# Patient Record
Sex: Male | Born: 1945 | Race: White | Hispanic: No | Marital: Married | Smoking: Never smoker
Health system: Southern US, Community
[De-identification: ages and names within clinical notes are randomized; demographics above are authoritative.]

## PROBLEM LIST (undated history)

## (undated) DIAGNOSIS — M1990 Unspecified osteoarthritis, unspecified site: Secondary | ICD-10-CM

## (undated) DIAGNOSIS — I2510 Atherosclerotic heart disease of native coronary artery without angina pectoris: Secondary | ICD-10-CM

## (undated) DIAGNOSIS — Z974 Presence of external hearing-aid: Secondary | ICD-10-CM

## (undated) DIAGNOSIS — I10 Essential (primary) hypertension: Secondary | ICD-10-CM

## (undated) DIAGNOSIS — R42 Dizziness and giddiness: Secondary | ICD-10-CM

## (undated) DIAGNOSIS — C801 Malignant (primary) neoplasm, unspecified: Secondary | ICD-10-CM

## (undated) DIAGNOSIS — I209 Angina pectoris, unspecified: Secondary | ICD-10-CM

## (undated) DIAGNOSIS — J45909 Unspecified asthma, uncomplicated: Secondary | ICD-10-CM

## (undated) DIAGNOSIS — K219 Gastro-esophageal reflux disease without esophagitis: Secondary | ICD-10-CM

## (undated) HISTORY — PX: VASECTOMY: SHX75

## (undated) HISTORY — PX: HERNIA REPAIR: SHX51

## (undated) HISTORY — PX: TONSILLECTOMY: SUR1361

## (undated) HISTORY — PX: CARDIAC CATHETERIZATION: SHX172

## (undated) HISTORY — PX: KNEE ARTHROSCOPY: SUR90

## (undated) HISTORY — PX: SHOULDER ARTHROSCOPY W/ ROTATOR CUFF REPAIR: SHX2400

---

## 2016-09-12 ENCOUNTER — Other Ambulatory Visit: Payer: Self-pay | Admitting: Surgery

## 2016-09-12 DIAGNOSIS — R2241 Localized swelling, mass and lump, right lower limb: Secondary | ICD-10-CM

## 2016-09-15 ENCOUNTER — Ambulatory Visit: Payer: Medicare HMO

## 2016-09-15 ENCOUNTER — Ambulatory Visit
Admission: RE | Admit: 2016-09-15 | Discharge: 2016-09-15 | Disposition: A | Discharge status: Home / Self Care | Payer: Medicare HMO | Source: Ambulatory Visit | Attending: Surgery | Admitting: Surgery

## 2016-09-15 DIAGNOSIS — R2241 Localized swelling, mass and lump, right lower limb: Secondary | ICD-10-CM | POA: Diagnosis not present

## 2016-09-15 LAB — POCT I-STAT CREATININE: CREATININE: 0.8 mg/dL (ref 0.61–1.24)

## 2016-09-15 MED ORDER — GADOBENATE DIMEGLUMINE 529 MG/ML IV SOLN
15.0000 mL | Freq: Once | INTRAVENOUS | Status: AC | PRN
  Administered 2016-09-15: 15 mL via INTRAVENOUS

## 2016-10-25 ENCOUNTER — Encounter
Admission: RE | Admit: 2016-10-25 | Discharge: 2016-10-25 | Disposition: A | Discharge status: Home / Self Care | Payer: Medicare HMO | Source: Ambulatory Visit | Attending: Surgery | Admitting: Surgery

## 2016-10-25 DIAGNOSIS — I10 Essential (primary) hypertension: Secondary | ICD-10-CM | POA: Diagnosis not present

## 2016-10-25 DIAGNOSIS — Z01810 Encounter for preprocedural cardiovascular examination: Secondary | ICD-10-CM | POA: Insufficient documentation

## 2016-10-25 DIAGNOSIS — Z01812 Encounter for preprocedural laboratory examination: Secondary | ICD-10-CM | POA: Diagnosis present

## 2016-10-25 DIAGNOSIS — R42 Dizziness and giddiness: Secondary | ICD-10-CM | POA: Diagnosis not present

## 2016-10-25 DIAGNOSIS — R001 Bradycardia, unspecified: Secondary | ICD-10-CM | POA: Diagnosis not present

## 2016-10-25 HISTORY — DX: Unspecified asthma, uncomplicated: J45.909

## 2016-10-25 HISTORY — DX: Dizziness and giddiness: R42

## 2016-10-25 HISTORY — DX: Malignant (primary) neoplasm, unspecified: C80.1

## 2016-10-25 HISTORY — DX: Atherosclerotic heart disease of native coronary artery without angina pectoris: I25.10

## 2016-10-25 HISTORY — DX: Essential (primary) hypertension: I10

## 2016-10-25 HISTORY — DX: Unspecified osteoarthritis, unspecified site: M19.90

## 2016-10-25 HISTORY — DX: Angina pectoris, unspecified: I20.9

## 2016-10-25 LAB — COMPREHENSIVE METABOLIC PANEL
ALT: 18 U/L (ref 17–63)
AST: 24 U/L (ref 15–41)
Albumin: 3.7 g/dL (ref 3.5–5.0)
Alkaline Phosphatase: 37 U/L — ABNORMAL LOW (ref 38–126)
Anion gap: 8 (ref 5–15)
BUN: 16 mg/dL (ref 6–20)
CHLORIDE: 102 mmol/L (ref 101–111)
CO2: 28 mmol/L (ref 22–32)
CREATININE: 0.85 mg/dL (ref 0.61–1.24)
Calcium: 8.8 mg/dL — ABNORMAL LOW (ref 8.9–10.3)
GFR calc non Af Amer: >60 mL/min (ref >60)
Glucose, Bld: 101 mg/dL — ABNORMAL HIGH (ref 65–99)
POTASSIUM: 4 mmol/L (ref 3.5–5.1)
SODIUM: 138 mmol/L (ref 135–145)
Total Bilirubin: 1.2 mg/dL (ref 0.3–1.2)
Total Protein: 6.8 g/dL (ref 6.5–8.1)

## 2016-10-25 LAB — CBC
HCT: 40.3 % (ref 40.0–52.0)
Hemoglobin: 13.4 g/dL (ref 13.0–18.0)
MCH: 28.6 pg (ref 26.0–34.0)
MCHC: 33.2 g/dL (ref 32.0–36.0)
MCV: 86.2 fL (ref 80.0–100.0)
PLATELETS: 102 10*3/uL — AB (ref 150–440)
RBC: 4.68 MIL/uL (ref 4.40–5.90)
RDW: 13.4 % (ref 11.5–14.5)
WBC: 7.3 10*3/uL (ref 3.8–10.6)

## 2016-10-25 LAB — DIFFERENTIAL
BASOS ABS: 0 10*3/uL (ref 0–0.1)
BASOS PCT: 1 %
EOS ABS: 0.3 10*3/uL (ref 0–0.7)
Eosinophils Relative: 4 %
Lymphocytes Relative: 25 %
Lymphs Abs: 1.8 10*3/uL (ref 1.0–3.6)
MONO ABS: 0.8 10*3/uL (ref 0.2–1.0)
Monocytes Relative: 11 %
NEUTROS ABS: 4.3 10*3/uL (ref 1.4–6.5)
Neutrophils Relative %: 59 %

## 2016-10-25 NOTE — Pre-Procedure Instructions (Signed)
Component Name Value Ref Range  Vent Rate (bpm) 57   PR Interval (msec) 198   QRS Interval (msec) 108   QT Interval (msec) 408   QTc (msec) 397   Result Narrative  Sinus bradycardia Nonspecific ST abnormality Abnormal ECG When compared with ECG of 26-Feb-2016 09:17, Nonspecific T wave abnormalities no longer evident in Lateral leads I reviewed and concur with this report. Electronically signed [id number], MD, Christia Reading ([id number]) on 03/04/2016 4:09:37 PM  Status Results Details

## 2016-10-25 NOTE — Patient Instructions (Signed)
Your procedure is scheduled on: November 04, 2016 (FRIDAY ) Report to Same Day Surgery 2nd floor medical mall ([place] Entrance-take elevator on left to 2nd floor.  Check in with surgery information desk.) To find out your arrival time please call [phone] between 1PM - 3PM on November 03, 2016 (THURSDAY ) Remember: Instructions that are not followed completely may result in serious medical risk, up to and including death, or upon the discretion of your surgeon and anesthesiologist your surgery may need to be rescheduled.    _x___ 1. Do not eat food or drink liquids after midnight. No gum chewing or  hard candies                         __x__ 2. No Alcohol for 24 hours before or after surgery.   __x__3. No Smoking for 24 prior to surgery.   ____  4. Bring all medications with you on the day of surgery if instructed.    __x__ 5. Notify your doctor if there is any change in your medical condition     (cold, fever, infections).     Do not wear jewelry, make-up, hairpins, clips or nail polish.  Do not wear lotions, powders, or perfumes.   Do not shave 48 hours prior to surgery. Men may shave face and neck.  Do not bring valuables to the hospital.    [facility] is not responsible for any belongings or valuables.               Contacts, dentures or bridgework may not be worn into surgery.  Leave your suitcase in the car. After surgery it may be brought to your room.  For patients admitted to the hospital, discharge time is determined by your treatment team                        Patients discharged the day of surgery will not be allowed to drive home.  You will need someone to drive you home and stay with you the night of your procedure.    Please read over the following fact sheets that you were given:   [facility] Preparing for Surgery and or MRSA Information   ___ Take anti-hypertensive (unless it includes a diuretic), cardiac, seizure, asthma,     anti-reflux and psychiatric  medicines. These include:  1.   2.  3.  4.  5.  6.  ____Fleets enema or Magnesium Citrate as directed.   _x___ Use CHG Soap or sage wipes as directed on instruction sheet   ____ Use inhalers on the day of surgery and bring to hospital day of surgery  ____ Stop Metformin and Janumet 2 days prior to surgery.    ____ Take 1/2 of usual insulin dose the night before surgery and none on the morning surgery      _x___ Follow recommendations from Cardiologist, Pulmonologist or PCP regarding          stopping Aspirin, Coumadin, Plavix ,Eliquis, Effient, or Pradaxa, and Pletal. (PATIENT INSTRUCTED BY DR Tamala Julian OFFICE TO STOP ASPIRIN ON JULY  26 )  X____Stop Anti-inflammatories such as Advil, Aleve, Ibuprofen, Motrin, Naproxen, Naprosyn, Goodies powders or aspirin products. OK to take Tylenol    _x___ Stop supplements until after surgery.  But may continue Vitamin D, Vitamin B, and multivitamin  ( STOP GLUCOSAMINE CHONDROITIN NOW  )         ____ [employer]  C-Pap to the hospital.

## 2016-11-02 NOTE — Pre-Procedure Instructions (Signed)
CALLED DR DGNPHQ'N OFFICE HAD TO LM ON NURSES'S LINE TO FAX CLEARANCE TO PAT.DEBBIE IN MEDICAL RECORDS AT THAT OFFICE WILL FAX 48 HOUR HOLTER RESULTS

## 2016-11-02 NOTE — Pre-Procedure Instructions (Signed)
HOLTER REPORT ON CHART

## 2016-11-03 NOTE — Pre-Procedure Instructions (Signed)
Cardiology note from dr Lysle Rubens clearing for surgery on chart

## 2016-11-04 ENCOUNTER — Ambulatory Visit: Payer: Medicare HMO | Admitting: Certified Registered"

## 2016-11-04 ENCOUNTER — Encounter
Admission: RE | Disposition: A | Discharge status: Home / Self Care | Payer: Self-pay | Source: Ambulatory Visit | Attending: Surgery

## 2016-11-04 ENCOUNTER — Ambulatory Visit
Admission: RE | Admit: 2016-11-04 | Discharge: 2016-11-04 | Disposition: A | Discharge status: Home / Self Care | Payer: Medicare HMO | Source: Ambulatory Visit | Attending: Surgery | Admitting: Surgery

## 2016-11-04 ENCOUNTER — Encounter: Payer: Self-pay | Admitting: *Deleted

## 2016-11-04 DIAGNOSIS — J4520 Mild intermittent asthma, uncomplicated: Secondary | ICD-10-CM | POA: Insufficient documentation

## 2016-11-04 DIAGNOSIS — M1990 Unspecified osteoarthritis, unspecified site: Secondary | ICD-10-CM | POA: Diagnosis not present

## 2016-11-04 DIAGNOSIS — R2241 Localized swelling, mass and lump, right lower limb: Secondary | ICD-10-CM | POA: Diagnosis not present

## 2016-11-04 DIAGNOSIS — R42 Dizziness and giddiness: Secondary | ICD-10-CM | POA: Insufficient documentation

## 2016-11-04 DIAGNOSIS — Z808 Family history of malignant neoplasm of other organs or systems: Secondary | ICD-10-CM | POA: Diagnosis not present

## 2016-11-04 DIAGNOSIS — Z888 Allergy status to other drugs, medicaments and biological substances status: Secondary | ICD-10-CM | POA: Insufficient documentation

## 2016-11-04 DIAGNOSIS — N529 Male erectile dysfunction, unspecified: Secondary | ICD-10-CM | POA: Diagnosis not present

## 2016-11-04 DIAGNOSIS — I10 Essential (primary) hypertension: Secondary | ICD-10-CM | POA: Insufficient documentation

## 2016-11-04 DIAGNOSIS — Z79899 Other long term (current) drug therapy: Secondary | ICD-10-CM | POA: Diagnosis not present

## 2016-11-04 DIAGNOSIS — I25118 Atherosclerotic heart disease of native coronary artery with other forms of angina pectoris: Secondary | ICD-10-CM | POA: Insufficient documentation

## 2016-11-04 DIAGNOSIS — L02425 Furuncle of right lower limb: Secondary | ICD-10-CM | POA: Insufficient documentation

## 2016-11-04 DIAGNOSIS — Z7982 Long term (current) use of aspirin: Secondary | ICD-10-CM | POA: Diagnosis not present

## 2016-11-04 DIAGNOSIS — I6523 Occlusion and stenosis of bilateral carotid arteries: Secondary | ICD-10-CM | POA: Diagnosis not present

## 2016-11-04 HISTORY — PX: EXCISION MASS LOWER EXTREMETIES: SHX6705

## 2016-11-04 SURGERY — EXCISION MASS LOWER EXTREMITIES
Anesthesia: General | Site: Thigh | Laterality: Right | Wound class: Clean

## 2016-11-04 MED ORDER — SEVOFLURANE IN SOLN
RESPIRATORY_TRACT | Status: AC
  Filled 2016-11-04: qty 250

## 2016-11-04 MED ORDER — FENTANYL CITRATE (PF) 100 MCG/2ML IJ SOLN: 25.0000 ug | INTRAMUSCULAR | Status: DC | PRN

## 2016-11-04 MED ORDER — BUPIVACAINE HCL (PF) 0.5 % IJ SOLN
INTRAMUSCULAR | Status: AC
  Filled 2016-11-04: qty 30

## 2016-11-04 MED ORDER — FENTANYL CITRATE (PF) 100 MCG/2ML IJ SOLN
INTRAMUSCULAR | Status: AC
  Filled 2016-11-04: qty 2

## 2016-11-04 MED ORDER — LIDOCAINE HCL (CARDIAC) 20 MG/ML IV SOLN
INTRAVENOUS | Status: DC | PRN
  Administered 2016-11-04: 60 mg via INTRAVENOUS

## 2016-11-04 MED ORDER — ONDANSETRON HCL 4 MG/2ML IJ SOLN
INTRAMUSCULAR | Status: AC
  Filled 2016-11-04: qty 2

## 2016-11-04 MED ORDER — ONDANSETRON HCL 4 MG/2ML IJ SOLN: 4.0000 mg | Freq: Once | INTRAMUSCULAR | Status: DC | PRN

## 2016-11-04 MED ORDER — HYDROCODONE-ACETAMINOPHEN 5-325 MG PO TABS: 1.0000 | ORAL_TABLET | ORAL | 0 refills | Status: DC | PRN

## 2016-11-04 MED ORDER — LIDOCAINE HCL (PF) 2 % IJ SOLN
INTRAMUSCULAR | Status: AC
  Filled 2016-11-04: qty 2

## 2016-11-04 MED ORDER — PROPOFOL 10 MG/ML IV BOLUS
INTRAVENOUS | Status: AC
  Filled 2016-11-04: qty 20

## 2016-11-04 MED ORDER — MIDAZOLAM HCL 2 MG/2ML IJ SOLN
INTRAMUSCULAR | Status: AC
  Filled 2016-11-04: qty 2

## 2016-11-04 MED ORDER — EPHEDRINE SULFATE 50 MG/ML IJ SOLN
INTRAMUSCULAR | Status: DC | PRN
  Administered 2016-11-04: 10 mg via INTRAVENOUS

## 2016-11-04 MED ORDER — GLYCOPYRROLATE 0.2 MG/ML IJ SOLN
INTRAMUSCULAR | Status: DC | PRN
  Administered 2016-11-04: 0.2 mg via INTRAVENOUS

## 2016-11-04 MED ORDER — FENTANYL CITRATE (PF) 100 MCG/2ML IJ SOLN
INTRAMUSCULAR | Status: DC | PRN
  Administered 2016-11-04: 25 ug via INTRAVENOUS

## 2016-11-04 MED ORDER — EPHEDRINE SULFATE 50 MG/ML IJ SOLN
INTRAMUSCULAR | Status: AC
  Filled 2016-11-04: qty 1

## 2016-11-04 MED ORDER — BUPIVACAINE-EPINEPHRINE (PF) 0.5% -1:200000 IJ SOLN
INTRAMUSCULAR | Status: AC
  Filled 2016-11-04: qty 30

## 2016-11-04 MED ORDER — DEXAMETHASONE SODIUM PHOSPHATE 10 MG/ML IJ SOLN
INTRAMUSCULAR | Status: DC | PRN
  Administered 2016-11-04: 10 mg via INTRAVENOUS

## 2016-11-04 MED ORDER — HYDROCODONE-ACETAMINOPHEN 5-325 MG PO TABS
1.0000 | ORAL_TABLET | ORAL | Status: DC | PRN
Start: 2016-11-04 — End: 2016-11-04

## 2016-11-04 MED ORDER — FAMOTIDINE 20 MG PO TABS
20.0000 mg | ORAL_TABLET | Freq: Once | ORAL | Status: AC
  Administered 2016-11-04: 20 mg via ORAL

## 2016-11-04 MED ORDER — PHENYLEPHRINE HCL 10 MG/ML IJ SOLN
INTRAMUSCULAR | Status: AC
  Filled 2016-11-04: qty 1

## 2016-11-04 MED ORDER — DEXAMETHASONE SODIUM PHOSPHATE 10 MG/ML IJ SOLN
INTRAMUSCULAR | Status: AC
  Filled 2016-11-04: qty 1

## 2016-11-04 MED ORDER — FAMOTIDINE 20 MG PO TABS
ORAL_TABLET | ORAL | Status: AC
  Filled 2016-11-04: qty 1

## 2016-11-04 MED ORDER — ONDANSETRON HCL 4 MG/2ML IJ SOLN
INTRAMUSCULAR | Status: DC | PRN
  Administered 2016-11-04: 4 mg via INTRAVENOUS

## 2016-11-04 MED ORDER — LIDOCAINE HCL (PF) 1 % IJ SOLN
INTRAMUSCULAR | Status: AC
  Filled 2016-11-04: qty 30

## 2016-11-04 MED ORDER — GLYCOPYRROLATE 0.2 MG/ML IJ SOLN
INTRAMUSCULAR | Status: AC
  Filled 2016-11-04: qty 1

## 2016-11-04 MED ORDER — BUPIVACAINE-EPINEPHRINE (PF) 0.5% -1:200000 IJ SOLN
INTRAMUSCULAR | Status: DC | PRN
  Administered 2016-11-04: 15 mL via PERINEURAL

## 2016-11-04 MED ORDER — MIDAZOLAM HCL 2 MG/2ML IJ SOLN
INTRAMUSCULAR | Status: DC | PRN
  Administered 2016-11-04: 2 mg via INTRAVENOUS

## 2016-11-04 MED ORDER — PROPOFOL 10 MG/ML IV BOLUS
INTRAVENOUS | Status: DC | PRN
  Administered 2016-11-04: 140 mg via INTRAVENOUS
  Administered 2016-11-04: 20 mg via INTRAVENOUS

## 2016-11-04 MED ORDER — LACTATED RINGERS IV SOLN
INTRAVENOUS | Status: DC
  Administered 2016-11-04: 08:00:00 via INTRAVENOUS

## 2016-11-04 SURGICAL SUPPLY — 30 items
BLADE CLIPPER SURG (BLADE) ×2 IMPLANT
BLADE SURG 15 STRL LF DISP TIS (BLADE) ×1 IMPLANT
BLADE SURG 15 STRL SS (BLADE) ×1
CHLORAPREP W/TINT 26ML (MISCELLANEOUS) ×2 IMPLANT
DERMABOND ADVANCED (GAUZE/BANDAGES/DRESSINGS) ×1
DERMABOND ADVANCED .7 DNX12 (GAUZE/BANDAGES/DRESSINGS) ×1 IMPLANT
DRAPE LAPAROTOMY 100X77 ABD (DRAPES) ×2 IMPLANT
ELECT REM PT RETURN 9FT ADLT (ELECTROSURGICAL) ×2
ELECTRODE REM PT RTRN 9FT ADLT (ELECTROSURGICAL) ×1 IMPLANT
GLOVE BIO SURGEON STRL SZ7 (GLOVE) ×4 IMPLANT
GLOVE BIO SURGEON STRL SZ7.5 (GLOVE) ×2 IMPLANT
GLOVE BIOGEL PI IND STRL 7.5 (GLOVE) ×2 IMPLANT
GLOVE BIOGEL PI INDICATOR 7.5 (GLOVE) ×2
GOWN STRL REUS W/ TWL LRG LVL3 (GOWN DISPOSABLE) ×3 IMPLANT
GOWN STRL REUS W/TWL LRG LVL3 (GOWN DISPOSABLE) ×3
KIT RM TURNOVER STRD PROC AR (KITS) ×2 IMPLANT
LABEL OR SOLS (LABEL) ×2 IMPLANT
NEEDLE HYPO 25X1 1.5 SAFETY (NEEDLE) ×2 IMPLANT
NS IRRIG 500ML POUR BTL (IV SOLUTION) ×2 IMPLANT
PACK BASIN MINOR ARMC (MISCELLANEOUS) ×2 IMPLANT
SUT CHROMIC 4 0 RB 1X27 (SUTURE) ×2 IMPLANT
SUT MNCRL 3-0 UNDYED SH (SUTURE) ×1 IMPLANT
SUT MNCRL 4-0 (SUTURE) ×1
SUT MNCRL 4-0 27XMFL (SUTURE) ×1
SUT MONOCRYL 3-0 UNDYED (SUTURE) ×1
SUT VIC AB 4-0 SH 27 (SUTURE) ×1
SUT VIC AB 4-0 SH 27XANBCTRL (SUTURE) ×1 IMPLANT
SUTURE MNCRL 4-0 27XMF (SUTURE) ×1 IMPLANT
SYR BULB EAR ULCER 3OZ GRN STR (SYRINGE) ×2 IMPLANT
SYRINGE 10CC LL (SYRINGE) ×2 IMPLANT

## 2016-11-04 NOTE — Progress Notes (Signed)
Dr. Tamala Julian into see and checked throat

## 2016-11-04 NOTE — Anesthesia Post-op Follow-up Note (Cosign Needed)
Anesthesia QCDR form completed.        

## 2016-11-04 NOTE — Discharge Instructions (Signed)
Take Tylenol or Norco if needed for pain.  Should not drive or do anything dangerous when taking Norco.  Resume aspirin on Sunday.  May shower and blot dry.  Gradually increase activities as tolerated.

## 2016-11-04 NOTE — Progress Notes (Signed)
Dr. Andree Elk into see and check pt

## 2016-11-04 NOTE — Transfer of Care (Signed)
Immediate Anesthesia Transfer of Care Note  Patient: Earl Lyons  Procedure(s) Performed: Procedure(s): EXCISION OF MASS  RIGHT THIGH (Right)  Patient Location: PACU  Anesthesia Type:General  Level of Consciousness: drowsy and patient cooperative  Airway & Oxygen Therapy: Patient Spontanous Breathing and Patient connected to face mask oxygen  Post-op Assessment: Report given to RN and Post -op Vital signs reviewed and stable  Post vital signs: Reviewed and stable  Last Vitals:  Vitals:   11/04/16 0606 11/04/16 0900  BP: (!) 152/65   Pulse: (!) 44   Resp: 16   Temp: (!) 36.4 C (!) (P) 36.2 C    Last Pain:  Vitals:   11/04/16 0606  TempSrc: Oral         Complications: No apparent anesthesia complications

## 2016-11-04 NOTE — Anesthesia Procedure Notes (Signed)
Procedure Name: LMA Insertion Date/Time: 11/04/2016 7:41 AM Performed by: Silvana Newness Pre-anesthesia Checklist: Patient identified, Emergency Drugs available, Suction available, Patient being monitored and Timeout performed Patient Re-evaluated:Patient Re-evaluated prior to induction Oxygen Delivery Method: Circle system utilized Preoxygenation: Pre-oxygenation with 100% oxygen Induction Type: IV induction Ventilation: Mask ventilation without difficulty LMA: LMA inserted LMA Size: 4.0 Number of attempts: 1 Placement Confirmation: positive ETCO2 and breath sounds checked- equal and bilateral Tube secured with: Tape Dental Injury: Teeth and Oropharynx as per pre-operative assessment

## 2016-11-04 NOTE — H&P (Signed)
  He comes in today prepared for excision of a mass of the right anterior proximal thigh.  Since the office exam he has developed a boil of the left distal anterior thigh. The boil was lanced several days ago and now improving.  On exam the mass of the right thigh was identified. Also noted that the site of the boil of the left thigh appears to be decreasing in size.  Lab work was reviewed play count is moderately low.  I discussed the plan for excision of a mass of the right anterior thigh. The right side was marked YES

## 2016-11-04 NOTE — Progress Notes (Signed)
Complains with discomfort throat pallet   Dr Andree Elk xcalled to check patient

## 2016-11-04 NOTE — Op Note (Signed)
OPERATIVE REPORT  PREOPERATIVE  DIAGNOSIS: . Mass of right thigh  POSTOPERATIVE DIAGNOSIS: . Mass of right thigh  PROCEDURE: . Excision mass of right thigh  ANESTHESIA:  General  SURGEON: Rochel Brome  MD   INDICATIONS: . He has a history of a mass of the right side which dates back to the 98s. He reports in recent months he has had increased size of the mass with some minor pain. He did have physical findings of a heart palpable mass approximately 6 x 6 cm in dimension with a smaller 2-3 cm area of prominence centrally located. MRI appeared benign. Surgery was recommended for definitive treatment.  With the patient on the operating table in the supine position he was placed under general anesthesia. The right proximal anterior lateral portion of the thigh was prepared with ChloraPrep and draped in a sterile manner. A longitudinally oriented incision was made 7 centimeters in length and carried down through the thin layer of subcutaneous tissues. Several small bleeding points are cauterized. The mass was encountered and was hard. This was dissected free from surrounding tissues in which it was densely adherent. The underside of the mass did involve the deep fascia and some of the fascia was removed in continuity with the mass. The mass was peeled off underlying muscle. During the course of dissection there was some drainage of a wet thick chalky-appearing material which was a yellow white color but did not appear to be pus. The mass was submitted in formalin for routine pathology. The wound was inspected and numerous small bleeding points were cauterized. Subcuticular tissues and also deeper tissues including fascia and muscle surrounding cautery artifact were infiltrated with half percent Sensorcaine with epinephrine. There was approximately 2 cm gap in  fascia and was not closed. Subcutaneous tissues were approximated with interrupted inverted 4-0 chromic suture. The skin was closed with running  4-0 Monocryl subcuticular suture and Dermabond  The patient appeared to tolerate the procedure satisfactorily and was then prepared for transfer to the recovery room  [employer].D.

## 2016-11-04 NOTE — Anesthesia Preprocedure Evaluation (Signed)
Anesthesia Evaluation  Patient identified by MRN, date of birth, ID band Patient awake    Reviewed: Allergy & Precautions, H&P , NPO status , Patient's Chart, lab work & pertinent test results, reviewed documented beta blocker date and time   Airway Mallampati: II  TM Distance: >3 FB Neck ROM: full    Dental  (+) Teeth Intact   Pulmonary neg pulmonary ROS, neg shortness of breath, asthma ,    Pulmonary exam normal        Cardiovascular Exercise Tolerance: Good hypertension, + angina with exertion + CAD  negative cardio ROS Normal cardiovascular exam Rate:Normal  Recent Cardiology eval with [facility] noted.  Stable 1 vessel disease.  Walks 33mile twice a day without recurrent angina after initiation of medication management.  JA   Neuro/Psych negative neurological ROS  negative psych ROS   GI/Hepatic negative GI ROS, Neg liver ROS,   Endo/Other  negative endocrine ROS  Renal/GU negative Renal ROS  negative genitourinary   Musculoskeletal   Abdominal   Peds  Hematology negative hematology ROS (+)   Anesthesia Other Findings   Reproductive/Obstetrics negative OB ROS                             Anesthesia Physical Anesthesia Plan  ASA: III  Anesthesia Plan: General LMA   Post-op Pain Management:    Induction:   PONV Risk Score and Plan: 3 and Ondansetron, Dexamethasone, Midazolam and Propofol infusion  Airway Management Planned:   Additional Equipment:   Intra-op Plan:   Post-operative Plan:   Informed Consent: I have reviewed the patients History and Physical, chart, labs and discussed the procedure including the risks, benefits and alternatives for the proposed anesthesia with the patient or authorized representative who has indicated his/her understanding and acceptance.     Plan Discussed with: CRNA  Anesthesia Plan Comments:         Anesthesia Quick Evaluation

## 2016-11-07 LAB — SURGICAL PATHOLOGY

## 2016-11-08 NOTE — Anesthesia Postprocedure Evaluation (Signed)
Anesthesia Post Note  Patient: Earl Lyons  Procedure(s) Performed: Procedure(s) (LRB): EXCISION OF MASS  RIGHT THIGH (Right)  Patient location during evaluation: PACU Anesthesia Type: General Level of consciousness: awake and alert Pain management: pain level controlled Vital Signs Assessment: post-procedure vital signs reviewed and stable Respiratory status: spontaneous breathing, nonlabored ventilation, respiratory function stable and patient connected to nasal cannula oxygen Cardiovascular status: blood pressure returned to baseline and stable Postop Assessment: no signs of nausea or vomiting Anesthetic complications: no     Last Vitals:  Vitals:   11/04/16 0942 11/04/16 1056  BP: (!) 148/56 139/61  Pulse: (!) 51 (!) 52  Resp: 16   Temp: (!) 36.3 C     Last Pain:  Vitals:   11/07/16 0754  TempSrc:   PainSc: 0-No pain                 Molli Barrows

## 2017-09-04 ENCOUNTER — Other Ambulatory Visit: Payer: Self-pay | Admitting: Nurse Practitioner

## 2017-09-04 DIAGNOSIS — R42 Dizziness and giddiness: Secondary | ICD-10-CM

## 2017-09-12 ENCOUNTER — Ambulatory Visit (HOSPITAL_COMMUNITY)
Admission: RE | Admit: 2017-09-12 | Discharge: 2017-09-12 | Disposition: A | Discharge status: Home / Self Care | Payer: Medicare HMO | Source: Ambulatory Visit | Attending: Nurse Practitioner | Admitting: Nurse Practitioner

## 2017-09-12 DIAGNOSIS — J3489 Other specified disorders of nose and nasal sinuses: Secondary | ICD-10-CM | POA: Insufficient documentation

## 2017-09-12 DIAGNOSIS — R9082 White matter disease, unspecified: Secondary | ICD-10-CM | POA: Diagnosis not present

## 2017-09-12 DIAGNOSIS — R42 Dizziness and giddiness: Secondary | ICD-10-CM | POA: Diagnosis not present

## 2017-09-12 LAB — POCT I-STAT CREATININE: Creatinine, Ser: 1 mg/dL (ref 0.61–1.24)

## 2017-09-12 MED ORDER — GADOBENATE DIMEGLUMINE 529 MG/ML IV SOLN
15.0000 mL | Freq: Once | INTRAVENOUS | Status: AC | PRN
  Administered 2017-09-12: 15 mL via INTRAVENOUS

## 2017-09-14 ENCOUNTER — Ambulatory Visit: Payer: Medicare HMO

## 2018-12-27 ENCOUNTER — Other Ambulatory Visit: Payer: Self-pay

## 2018-12-27 ENCOUNTER — Encounter: Payer: Self-pay | Admitting: Hematology and Oncology

## 2018-12-27 NOTE — Progress Notes (Signed)
[facility]  [address] [place], [place] [address] Phone: [phone]  Fax: [phone]   Clinic Day:  12/28/2018  Referring physician: Jannet Mantis, *  Chief Complaint: Earl Lyons is a 73 y.o. male with a neoplasm of uncertain behavior of the skin who is referred in consultation by Dr. Ree Edman for assessment and management.   HPI:  The patient has a history of excessive sun exposure and sunburns.  He has a history of stage 0 malignant melanoma ([id number]) in 04/22/2016.  He has a history of squamous cell carcinoma of the skin.  He describes a prior Moh's procedure.   The patient noticed a lesion on his forehead about 4-6 weeks ago. He initially thought it was an insect bite.  He was seen on 11/22/2018 for regular dermatology follow-up.  Notes indicate a 1.2 cm brown telangiectasia ectatic nodule as well as a 0.8 cm pink scaly plaque lesion with central eschar.    Forehead medial shave biopsy on 11/22/2018 revealed marginal zone lymphoma with margins involved (case# [id number]).  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  In addition, there was squamous cell carcinoma in situ, ulcerated, at the medial frontal scalp.  There was actinic keratosis extending to the edges but without carcinoma.  Symptomatically, he notes some weight loss; he is 3 lbs lighter than he was in high school (weight 155 pounds). He denies fevers or sweats.  He denies any adenopathy or early satiety.  He denies any bruising or bleeding.  He sees a cardiologist. He has single vessel coronary artery disease. He has a chronically low pulse (typically 50); today his pulse is 48. He notes that this is normal for him as he is fairly active. He is a former runner.   He describes a healthy diet. He eats leafy green vegetables. He does not eat as much meat as he use to eat.   He donates blood regularly. The  last time he donated was about a year ago.  Family history is notable for skin cancer in his mother. There is no known history for any blood disorders, blood cancers, or lymphoma.    Past Medical History:  Diagnosis Date  . Anginal pain ([place])    In the past--not currently  . Arthritis    Osteoarthritis  . Asthma    IN THE PAST (NO INHALERS IN THE PAST FIVE YEARS)  . Cancer ([place])    Melanomas Skin Cancer  . Coronary artery disease   . Hypertension   . Vertigo     Past Surgical History:  Procedure Laterality Date  . CARDIAC CATHETERIZATION    . EXCISION MASS LOWER EXTREMETIES Right 11/04/2016   Procedure: EXCISION OF MASS  RIGHT THIGH;  Surgeon: Leonie Green, MD;  Location: [facility] ORS;  Service: General;  Laterality: Right;  . HERNIA REPAIR Right    Inguinal Hernia Repair  . KNEE ARTHROSCOPY Right   . SHOULDER ARTHROSCOPY W/ ROTATOR CUFF REPAIR Right   . TONSILLECTOMY    . VASECTOMY      Family History  Problem Relation Age of Onset  . Skin cancer Mother   . Emphysema Mother   . Colon cancer Maternal Uncle     Social History:  reports that he has never smoked. He has never used smokeless tobacco. He reports that he does not drink alcohol or use drugs. He denies any known exposure to chemicals. He states that  he underwent a radiation treatment to treat acne as a child. He use to run in school; more recently he has been biking for exercise. 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 He has lived in [place] since 2013. He is married. He has 3 daughters. The patient is alone today.  Allergies:  Allergies  Allergen Reactions  . Metoprolol Other (See Comments)    bradycardia    Current Medications: Current Outpatient Medications  Medication Sig Dispense Refill  . amLODipine (NORVASC) 5 MG tablet Take 5 mg by mouth every evening.    Marland Kitchen aspirin EC 81 MG tablet Take 81 mg by mouth every evening.     Marland Kitchen atorvastatin (LIPITOR) 40 MG tablet Take 40  mg by mouth every evening.    . cholecalciferol (VITAMIN D) 1000 units tablet Take 1,000 Units by mouth daily.    . fluocinonide cream (LIDEX) 0.05 % Apply topically bid prn    . fluticasone (FLONASE) 50 MCG/ACT nasal spray Place into the nose.    . lamoTRIgine (LAMICTAL) 150 MG tablet Take by mouth.    . lamoTRIgine (LAMICTAL) 25 MG tablet     . lisinopril (ZESTRIL) 20 MG tablet Take by mouth.    . nitroGLYCERIN (NITROSTAT) 0.4 MG SL tablet Place under the tongue.    . sildenafil (VIAGRA) 50 MG tablet Take by mouth.    . triamcinolone ointment (KENALOG) 0.5 % Apply 1 application topically 2 (two) times daily as needed (itching). Applies to inside of ears when itching      No current facility-administered medications for this visit.     Review of Systems  Constitutional: Negative for chills, diaphoresis, fever, malaise/fatigue and weight loss (3 lbs under college weight).  HENT: Negative.  Negative for congestion, ear pain, hearing loss, nosebleeds, sinus pain and sore throat.   Eyes: Negative.  Negative for blurred vision, double vision, photophobia and pain.       Vision is slightly worse.  He is due for vision exam.  Respiratory: Negative.  Negative for cough, hemoptysis, shortness of breath and wheezing.   Cardiovascular: Negative.  Negative for chest pain and leg swelling.       Baseline asymptomatic bradycardia.  Gastrointestinal: Negative.  Negative for blood in stool, constipation, diarrhea, melena, nausea and vomiting.       Colonoscopy due 2021.  Genitourinary: Negative.  Negative for frequency, hematuria and urgency.  Musculoskeletal: Positive for joint pain (arthritis in hands, knees, R foot, L shoulder). Negative for back pain and myalgias.  Skin: Negative for rash.       Many moles over the years.   Neurological: Negative for dizziness and weakness.  Endo/Heme/Allergies: Negative.  Does not bruise/bleed easily.  Psychiatric/Behavioral: Negative for depression and memory  loss. The patient is not nervous/anxious.    Performance status (ECOG): 0  Vitals Blood pressure (!) 149/67, pulse (!) 48, temperature (!) 96.2 F (35.7 C), temperature source Tympanic, resp. rate 16, height 5\' 11"  (1.803 m), weight 158 lb 2.9 oz (71.7 kg).   Physical Exam  Constitutional: He is oriented to person, place, and time. He appears well-developed and well-nourished. No distress.  HENT:  Head: Atraumatic.  Mouth/Throat: No oropharyngeal exudate.  Thin short gray hair.  Mask.  Eyes: Pupils are equal, round, and reactive to light. Conjunctivae and EOM are normal. No scleral icterus.  Blue eyes.  Neck: Normal range of motion. Neck supple. No JVD present.  Cardiovascular: Regular rhythm. Bradycardia present. Exam reveals no  gallop and no friction rub.  No murmur heard. Pulmonary/Chest: Effort normal and breath sounds normal. He has no wheezes. He has no rales.  Abdominal: Soft. Bowel sounds are normal. He exhibits no mass. There is no abdominal tenderness. There is no rebound and no guarding.  Musculoskeletal:        General: No tenderness or edema.  Lymphadenopathy:    He has no cervical adenopathy.    He has no axillary adenopathy.       Right: No inguinal and no supraclavicular adenopathy present.       Left: No inguinal and no supraclavicular adenopathy present.  Neurological: He is alert and oriented to person, place, and time.  Skin: Skin is warm. No rash noted. He is not diaphoretic. No pallor.  Skin changes s/p biopsy with slight nodularity on scalp.  Three well healed incisions on back and 1 on right flank.  Psychiatric: He has a normal mood and affect. His behavior is normal. Judgment and thought content normal.  Nursing note and vitals reviewed.       Forehead   No visits with results within 3 Day(s) from this visit.  Latest known visit with results is:  Hospital Outpatient Visit on 09/12/2017  Component Date Value Ref Range Status  . Creatinine, Ser  09/12/2017 1.00  0.61 - 1.24 mg/dL Final    Assessment:  Earl Lyons is a 73 y.o. 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  There was actinic keratosis extending to the edges but without carcinoma.  Symptomatically, he denies any fevers or sweats.  He is unsure if he has lost 2-3 pounds recently.  He is active.  Exam reveals no adenopathy or hepatosplenomegaly.  Plan: 1.   Labs today:  CBC with diff, CMP, LDH, myeloma panel, hepatitis B core antibody total, hepatitis B surface antigen, hepatitis C testing. 2.   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   Typically extracutaneous involvement is rare (6%).   Discuss obtaining a PET scan (increased sensitivity over CTs).   No current plan for bone marrow (2% risk) unless unexplained cytopenia.   Review plan for screening labs.  Discuss likely plan for local therapy   Radiation therapy is primary modality.    Topicals (steroids, imiquimod, nitrogen mustard, bexarotene).  Obtain pathology for presentation at tumor board. 3.   Schedule PET scan on 01/01/2019. 4.   Tumor board presentation on 01/03/2019. 5.   RTC after PET scan and tumor board for MD assessment and discussion regarding direction of therapy.  I discussed the assessment and treatment plan with the patient.  The patient was provided an opportunity to ask questions and all were answered.  The patient agreed with the plan and demonstrated an understanding of the instructions.  The patient was advised to call back if the symptoms worsen or if the condition fails to improve as anticipated.   Melissa C. Mike Gip, MD, PhD    12/28/2018, 9:54 AM  I, Jacqualyn Posey, am acting as Education administrator for [employer]. Mike Gip, MD, PhD.  I, Melissa C. Mike Gip, MD, have reviewed the above documentation for accuracy and completeness, and I agree with the above.

## 2018-12-28 ENCOUNTER — Inpatient Hospital Stay: Payer: Medicare HMO | Attending: Hematology and Oncology | Admitting: Hematology and Oncology

## 2018-12-28 ENCOUNTER — Encounter: Payer: Self-pay | Admitting: Hematology and Oncology

## 2018-12-28 ENCOUNTER — Inpatient Hospital Stay: Payer: Medicare HMO

## 2018-12-28 DIAGNOSIS — C884 Extranodal marginal zone B-cell lymphoma of mucosa-associated lymphoid tissue [MALT-lymphoma]: Secondary | ICD-10-CM

## 2018-12-28 LAB — COMPREHENSIVE METABOLIC PANEL
ALT: 23 U/L (ref 0–44)
AST: 32 U/L (ref 15–41)
Albumin: 4.2 g/dL (ref 3.5–5.0)
Alkaline Phosphatase: 39 U/L (ref 38–126)
Anion gap: 8 (ref 5–15)
BUN: 18 mg/dL (ref 8–23)
CO2: 28 mmol/L (ref 22–32)
Calcium: 9.3 mg/dL (ref 8.9–10.3)
Chloride: 100 mmol/L (ref 98–111)
Creatinine, Ser: 0.97 mg/dL (ref 0.61–1.24)
GFR calc Af Amer: >60 mL/min (ref >60)
GFR calc non Af Amer: >60 mL/min (ref >60)
Glucose, Bld: 104 mg/dL — ABNORMAL HIGH (ref 70–99)
Potassium: 4.4 mmol/L (ref 3.5–5.1)
Sodium: 136 mmol/L (ref 135–145)
Total Bilirubin: 1.6 mg/dL — ABNORMAL HIGH (ref 0.3–1.2)
Total Protein: 7.3 g/dL (ref 6.5–8.1)

## 2018-12-28 LAB — CBC WITH DIFFERENTIAL/PLATELET
Abs Immature Granulocytes: 0.02 10*3/uL (ref 0.00–0.07)
Basophils Absolute: 0 10*3/uL (ref 0.0–0.1)
Basophils Relative: 1 %
Eosinophils Absolute: 0.1 10*3/uL (ref 0.0–0.5)
Eosinophils Relative: 2 %
HCT: 44.6 % (ref 39.0–52.0)
Hemoglobin: 15 g/dL (ref 13.0–17.0)
Immature Granulocytes: 0 %
Lymphocytes Relative: 21 %
Lymphs Abs: 1.5 10*3/uL (ref 0.7–4.0)
MCH: 29.7 pg (ref 26.0–34.0)
MCHC: 33.6 g/dL (ref 30.0–36.0)
MCV: 88.3 fL (ref 80.0–100.0)
Monocytes Absolute: 0.7 10*3/uL (ref 0.1–1.0)
Monocytes Relative: 10 %
Neutro Abs: 4.8 10*3/uL (ref 1.7–7.7)
Neutrophils Relative %: 66 %
Platelets: 133 10*3/uL — ABNORMAL LOW (ref 150–400)
RBC: 5.05 MIL/uL (ref 4.22–5.81)
RDW: 13.1 % (ref 11.5–15.5)
WBC: 7.2 10*3/uL (ref 4.0–10.5)
nRBC: 0 % (ref 0.0–0.2)

## 2018-12-28 LAB — LACTATE DEHYDROGENASE: LDH: 146 U/L (ref 98–192)

## 2018-12-28 NOTE — Progress Notes (Signed)
Patient here today for new consult.   Patient denies any nausea, vomiting, diarrhea, constipation, decrease in appetite, SOB or pain.

## 2018-12-29 LAB — HEPATITIS B SURFACE ANTIGEN: Hepatitis B Surface Ag: NEGATIVE

## 2018-12-29 LAB — HEPATITIS B CORE ANTIBODY, TOTAL: Hep B Core Total Ab: NEGATIVE

## 2018-12-29 LAB — HEPATITIS C ANTIBODY: HCV Ab: <0.1 s/co ratio (ref 0.0–0.9)

## 2018-12-31 LAB — MULTIPLE MYELOMA PANEL, SERUM
Albumin SerPl Elph-Mcnc: 3.9 g/dL (ref 2.9–4.4)
Albumin/Glob SerPl: 1.6 (ref 0.7–1.7)
Alpha 1: 0.2 g/dL (ref 0.0–0.4)
Alpha2 Glob SerPl Elph-Mcnc: 0.7 g/dL (ref 0.4–1.0)
B-Globulin SerPl Elph-Mcnc: 0.9 g/dL (ref 0.7–1.3)
Gamma Glob SerPl Elph-Mcnc: 0.8 g/dL (ref 0.4–1.8)
Globulin, Total: 2.6 g/dL (ref 2.2–3.9)
IgA: 249 mg/dL (ref 61–437)
IgG (Immunoglobin G), Serum: 1029 mg/dL (ref 603–1613)
IgM (Immunoglobulin M), Srm: 52 mg/dL (ref 15–143)
Total Protein ELP: 6.5 g/dL (ref 6.0–8.5)

## 2019-01-03 ENCOUNTER — Other Ambulatory Visit: Payer: Medicare HMO

## 2019-01-03 ENCOUNTER — Encounter
Admission: RE | Admit: 2019-01-03 | Discharge: 2019-01-03 | Disposition: A | Discharge status: Home / Self Care | Payer: Medicare HMO | Source: Ambulatory Visit | Attending: Hematology and Oncology | Admitting: Hematology and Oncology

## 2019-01-03 ENCOUNTER — Other Ambulatory Visit: Payer: Self-pay

## 2019-01-03 DIAGNOSIS — I10 Essential (primary) hypertension: Secondary | ICD-10-CM | POA: Diagnosis not present

## 2019-01-03 DIAGNOSIS — Z79899 Other long term (current) drug therapy: Secondary | ICD-10-CM | POA: Insufficient documentation

## 2019-01-03 DIAGNOSIS — Z7982 Long term (current) use of aspirin: Secondary | ICD-10-CM | POA: Diagnosis not present

## 2019-01-03 DIAGNOSIS — C884 Extranodal marginal zone B-cell lymphoma of mucosa-associated lymphoid tissue [MALT-lymphoma]: Secondary | ICD-10-CM | POA: Diagnosis not present

## 2019-01-03 DIAGNOSIS — I2510 Atherosclerotic heart disease of native coronary artery without angina pectoris: Secondary | ICD-10-CM | POA: Diagnosis not present

## 2019-01-03 LAB — GLUCOSE, CAPILLARY: Glucose-Capillary: 89 mg/dL (ref 70–99)

## 2019-01-03 MED ORDER — FLUDEOXYGLUCOSE F - 18 (FDG) INJECTION
9.0000 | Freq: Once | INTRAVENOUS | Status: AC | PRN
  Administered 2019-01-03: 12:00:00 9 via INTRAVENOUS

## 2019-01-03 NOTE — Progress Notes (Signed)
Tumor Board Documentation  Earl Lyons was presented by Dr Mike Gip at our Tumor Board on 01/03/2019, which included representatives from medical oncology, radiation oncology, pathology, radiology, surgical, navigation, internal medicine, research, palliative care, pulmonology.  Earl Lyons currently presents as a new patient, for [place], for new positive pathology with history of the following treatments: active survellience, surgical intervention(s).  Additionally, we reviewed previous medical and familial history, history of present illness, and recent lab results along with all available histopathologic and imaging studies. The tumor board considered available treatment options and made the following recommendations: Radiation therapy (primary modality) Radiation Therapy vs local treatment into the lesion  The following procedures/referrals were also placed: No orders of the defined types were placed in this encounter.   Clinical Trial Status: not discussed   Staging used: Pathologic Stage  AJCC Staging: T: 1a N: x M: x Group: Cutaneous Marginal Zone Lymphoma with positive margins   National site-specific guidelines   were discussed with respect to the case.  Tumor board is a meeting of clinicians from various specialty areas who evaluate and discuss patients for whom a multidisciplinary approach is being considered. Final determinations in the plan of care are those of the provider(s). The responsibility for follow up of recommendations given during tumor board is that of the provider.   Today's extended care, comprehensive team conference, Earl Lyons was not present for the discussion and was not examined.   Multidisciplinary Tumor Board is a multidisciplinary case peer review process.  Decisions discussed in the Multidisciplinary Tumor Board reflect the opinions of the specialists present at the conference without having examined the patient.  Ultimately, treatment and diagnostic decisions rest  with the primary provider(s) and the patient.

## 2019-01-06 NOTE — Progress Notes (Signed)
[facility]  [address], [address] [place], [place] [address] Phone: [phone]  Fax: [phone]   Clinic Day:  01/07/2019  Referring physician: Sharyne Peach, MD  Chief Complaint: Earl Lyons is a 73 y.o. 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   HPI: The patient was last seen in the medical oncology clinic on 12/28/2018 for initial consultation. He had undergone shave biopsy of a cutaneous lesion on his forehead which revealed marginal zone lymphoma.  Symptomatically,  he denied any fevers or sweats.  He was unsure if he has lost 2-3 pounds.  Exam revealed no adenopathy or hepatosplenomegaly.  Labs revealed a hematocrit 44.6, hemoglobin 15.0, MCV 88.3, platelets 133,000, and WBC 7200 with an ANC 4,800. LDH was 146. Bilirubin was 1.6.  M-spike was 0.  Hepatitis B core antibody total, hepatitis B surface antigen, and hepatitis C antibody were negative.  PET scan on 01/03/2019 revealed no radiographic evidence of malignancy.   Tumor Board recommendations on 01/03/2019 were for radiation therapy (primary modality).  During the interim, he has done fine.  He has gained some weight since last visit. He denies any B symptoms.  He denies any early satiety.   Past Medical History:  Diagnosis Date  . Anginal pain ([place])    In the past--not currently  . Arthritis    Osteoarthritis  . Asthma    IN THE PAST (NO INHALERS IN THE PAST FIVE YEARS)  . Cancer ([place])    Melanomas Skin Cancer  . Coronary artery disease   . Hypertension   . Vertigo     Past Surgical History:  Procedure Laterality Date  . CARDIAC CATHETERIZATION    . EXCISION MASS LOWER EXTREMETIES Right 11/04/2016   Procedure: EXCISION OF MASS  RIGHT THIGH;  Surgeon: Leonie Green, MD;  Location: [facility] ORS;  Service: General;  Laterality: Right;  . HERNIA REPAIR Right    Inguinal Hernia  Repair  . KNEE ARTHROSCOPY Right   . SHOULDER ARTHROSCOPY W/ ROTATOR CUFF REPAIR Right   . TONSILLECTOMY    . VASECTOMY      Family History  Problem Relation Age of Onset  . Skin cancer Mother   . Emphysema Mother   . Colon cancer Maternal Uncle     Social History:  reports that he has never smoked. He has never used smokeless tobacco. He reports that he does not drink alcohol or use drugs. He states that he underwent a radiation treatment to treat acne as a child. He use to run in school; more recently he has been biking for exercise. 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 He has lived in [place] since 2013. He is married. He has 3 daughters.  The patient is alone today.  Allergies:  Allergies  Allergen Reactions  . Metoprolol Other (See Comments)    bradycardia    Current Medications: Current Outpatient Medications  Medication Sig Dispense Refill  . amLODipine (NORVASC) 5 MG tablet Take 5 mg by mouth every evening.    Marland Kitchen aspirin EC 81 MG tablet Take 81 mg by mouth every evening.     Marland Kitchen atorvastatin (LIPITOR) 40 MG tablet Take 40 mg by mouth every evening.    . cholecalciferol (VITAMIN D) 1000 units tablet Take 1,000 Units by mouth daily.    Marland Kitchen lamoTRIgine (LAMICTAL) 150 MG tablet Take by mouth.    Marland Kitchen  lamoTRIgine (LAMICTAL) 25 MG tablet Take 100 mg by mouth daily.     Marland Kitchen lisinopril (ZESTRIL) 20 MG tablet Take by mouth.    . fluocinonide cream (LIDEX) 0.05 % Apply topically bid prn    . fluticasone (FLONASE) 50 MCG/ACT nasal spray Place into the nose.    . nitroGLYCERIN (NITROSTAT) 0.4 MG SL tablet Place under the tongue.    . sildenafil (VIAGRA) 50 MG tablet Take by mouth.    . triamcinolone ointment (KENALOG) 0.5 % Apply 1 application topically 2 (two) times daily as needed (itching). Applies to inside of ears when itching      No current facility-administered medications for this visit.     Review of Systems  Constitutional: Negative.  Negative  for chills, diaphoresis, fever, malaise/fatigue and weight loss (weight up 2 lbs).       Feels "pretty good".  HENT: Negative.  Negative for congestion, ear pain, hearing loss, nosebleeds, sinus pain and sore throat.   Eyes: Negative.  Negative for blurred vision, double vision, photophobia and pain.       No change in vision.  Due for vision exam.  Respiratory: Negative.  Negative for cough, hemoptysis, shortness of breath and wheezing.   Cardiovascular: Negative.  Negative for chest pain, palpitations, orthopnea and leg swelling.       Baseline asymptomatic bradycardia.  Gastrointestinal: Negative.  Negative for abdominal pain, blood in stool, constipation, diarrhea, melena, nausea and vomiting.       Colonoscopy due 2021.  Genitourinary: Negative.  Negative for dysuria, frequency, hematuria and urgency.  Musculoskeletal: Positive for joint pain (arthritis in hands, knees, R foot, L shoulder). Negative for back pain, myalgias and neck pain.  Skin: Negative for rash.       Many moles over the years.   Neurological: Negative.  Negative for dizziness, tingling, tremors, sensory change, speech change, focal weakness, weakness and headaches.  Endo/Heme/Allergies: Negative.  Does not bruise/bleed easily.  Psychiatric/Behavioral: Negative.  Negative for depression and memory loss. The patient is not nervous/anxious.    Performance status (ECOG): 0  Vitals Blood pressure (!) 125/57, pulse (!) 50, temperature (!) 96.1 F (35.6 C), temperature source Tympanic, resp. rate 18, height 5\' 11"  (1.803 m), weight 160 lb 0.9 oz (72.6 kg), SpO2 99 %.   Physical Exam  Constitutional: He is oriented to person, place, and time. He appears well-developed and well-nourished. No distress.  HENT:  Head: Normocephalic.  Thin short gray hair.  Male pattern baldness.  Mask.  Eyes: Conjunctivae and EOM are normal. No scleral icterus.  Blue eyes.  Neurological: He is alert and oriented to person, place, and time.   Skin: He is not diaphoretic. No pallor.  s/p biopsy on forehead.  Psychiatric: He has a normal mood and affect. His behavior is normal. Judgment and thought content normal.  Nursing note and vitals reviewed.   No visits with results within 3 Day(s) from this visit.  Latest known visit with results is:  Hospital Outpatient Visit on 01/03/2019  Component Date Value Ref Range Status  . Glucose-Capillary 01/03/2019 89  70 - 99 mg/dL Final    Assessment:  Earl Lyons is a 73 y.o. 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  There was actinic  keratosis extending to the edges but without carcinoma.  Work-up on 12/28/2018 revealed a hematocrit 44.6, hemoglobin 15.0, MCV 88.3, platelets 133,000, and WBC 7200 with an ANC 4,800. LDH was 146. Bilirubin was 1.6.  SPEP, hepatitis B core antibody total, hepatitis B surface antigen, and hepatitis C antibody were negative.  PET scan on 01/03/2019 revealed no radiographic evidence of malignancy.   Symptomatically, he is doing well.  He has gained 2 pounds.  Exam reveals no adenopathy or hepatosplenomegaly.  Bilirubin is 1.6 (direct 0.2).  Plan: 1.   Review labs from 12/28/2018. 2.   Labs today:  CBC with diff, platelet count in a blue top tube, LFTs, direct bilirubin. 3.   Clinical stage T1aNxMx cutaneous marginal zone lymphoma             He has localized disease.  PET scan reveals no evidence of lymphoma.  Review tumor board discussion. 4.   Mild thrombocytopenia  Platelet count was 133,000.  He has had a mild thrombocytopenia dating back to 10/25/2016.  Platelet count has ranged between 102,000 - 133,000.  No evidence of pseudothrombocytopenia. 5.   Mildly elevated  bilirubin.  Bilirubin was 1.6 on 12/28/2018 and 01/07/2019.  Direct fraction was 0.2.  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 6.   Radiation oncology consult. 7.   RTC in 3 months for MD assessment and labs (CBC with diff, CMP, LDH).  I discussed the assessment and treatment plan with the patient.  The patient was provided an opportunity to ask questions and all were answered.  The patient agreed with the plan and demonstrated an understanding of the instructions.  The patient was advised to call back if the symptoms worsen or if the condition fails to improve as anticipated.   Lequita Asal, MD, PhD    01/07/2019, 9:09 AM  I, Samul Dada, am acting as a scribe for Lequita Asal, MD.  I, Pound Mike Gip, MD, have reviewed the above documentation for accuracy and completeness, and I agree with the above.

## 2019-01-07 ENCOUNTER — Encounter: Payer: Self-pay | Admitting: Hematology and Oncology

## 2019-01-07 ENCOUNTER — Inpatient Hospital Stay: Payer: Medicare HMO | Attending: Hematology and Oncology | Admitting: Hematology and Oncology

## 2019-01-07 ENCOUNTER — Inpatient Hospital Stay: Payer: Medicare HMO

## 2019-01-07 ENCOUNTER — Other Ambulatory Visit: Payer: Self-pay

## 2019-01-07 VITALS — BP 125/57 | HR 50 | Temp 96.1°F | Resp 18 | Ht 71.0 in | Wt 160.1 lb

## 2019-01-07 DIAGNOSIS — Z79899 Other long term (current) drug therapy: Secondary | ICD-10-CM | POA: Insufficient documentation

## 2019-01-07 DIAGNOSIS — D696 Thrombocytopenia, unspecified: Secondary | ICD-10-CM | POA: Insufficient documentation

## 2019-01-07 DIAGNOSIS — I10 Essential (primary) hypertension: Secondary | ICD-10-CM | POA: Diagnosis not present

## 2019-01-07 DIAGNOSIS — C884 Extranodal marginal zone B-cell lymphoma of mucosa-associated lymphoid tissue [MALT-lymphoma]: Secondary | ICD-10-CM

## 2019-01-07 DIAGNOSIS — R17 Unspecified jaundice: Secondary | ICD-10-CM | POA: Diagnosis not present

## 2019-01-07 DIAGNOSIS — M1990 Unspecified osteoarthritis, unspecified site: Secondary | ICD-10-CM | POA: Insufficient documentation

## 2019-01-07 DIAGNOSIS — J45909 Unspecified asthma, uncomplicated: Secondary | ICD-10-CM | POA: Insufficient documentation

## 2019-01-07 DIAGNOSIS — I2510 Atherosclerotic heart disease of native coronary artery without angina pectoris: Secondary | ICD-10-CM | POA: Insufficient documentation

## 2019-01-07 DIAGNOSIS — Z7982 Long term (current) use of aspirin: Secondary | ICD-10-CM | POA: Insufficient documentation

## 2019-01-07 LAB — CBC WITH DIFFERENTIAL/PLATELET
Abs Immature Granulocytes: 0.01 10*3/uL (ref 0.00–0.07)
Basophils Absolute: 0.1 10*3/uL (ref 0.0–0.1)
Basophils Relative: 1 %
Eosinophils Absolute: 0.2 10*3/uL (ref 0.0–0.5)
Eosinophils Relative: 2 %
HCT: 42.1 % (ref 39.0–52.0)
Hemoglobin: 14.3 g/dL (ref 13.0–17.0)
Immature Granulocytes: 0 %
Lymphocytes Relative: 24 %
Lymphs Abs: 1.6 10*3/uL (ref 0.7–4.0)
MCH: 29.9 pg (ref 26.0–34.0)
MCHC: 34 g/dL (ref 30.0–36.0)
MCV: 88.1 fL (ref 80.0–100.0)
Monocytes Absolute: 0.7 10*3/uL (ref 0.1–1.0)
Monocytes Relative: 11 %
Neutro Abs: 4 10*3/uL (ref 1.7–7.7)
Neutrophils Relative %: 62 %
Platelets: 130 10*3/uL — ABNORMAL LOW (ref 150–400)
RBC: 4.78 MIL/uL (ref 4.22–5.81)
RDW: 12.9 % (ref 11.5–15.5)
WBC: 6.5 10*3/uL (ref 4.0–10.5)
nRBC: 0 % (ref 0.0–0.2)

## 2019-01-07 LAB — HEPATIC FUNCTION PANEL
ALT: 25 U/L (ref 0–44)
AST: 34 U/L (ref 15–41)
Albumin: 4 g/dL (ref 3.5–5.0)
Alkaline Phosphatase: 39 U/L (ref 38–126)
Bilirubin, Direct: 0.2 mg/dL (ref 0.0–0.2)
Indirect Bilirubin: 1.4 mg/dL — ABNORMAL HIGH (ref 0.3–0.9)
Total Bilirubin: 1.6 mg/dL — ABNORMAL HIGH (ref 0.3–1.2)
Total Protein: 7.1 g/dL (ref 6.5–8.1)

## 2019-01-07 LAB — PLATELET BY CITRATE: Platelet CT in Citrate: 125

## 2019-01-07 NOTE — Progress Notes (Signed)
No new changes noted today 

## 2019-01-08 ENCOUNTER — Encounter: Payer: Self-pay | Admitting: Hematology and Oncology

## 2019-01-08 ENCOUNTER — Telehealth: Payer: Self-pay

## 2019-01-08 NOTE — Telephone Encounter (Signed)
Spoke with the patient which he informed me that he would like to have labs and his last office note faxed for a second opition. The patient states he didn't have the information where he would like for the information to be faxed. He informed me he will contact me when he get the information.

## 2019-01-10 ENCOUNTER — Encounter: Payer: Self-pay | Admitting: Radiation Oncology

## 2019-01-11 ENCOUNTER — Other Ambulatory Visit: Payer: Self-pay

## 2019-01-14 ENCOUNTER — Other Ambulatory Visit: Payer: Self-pay

## 2019-01-14 ENCOUNTER — Ambulatory Visit
Admission: RE | Admit: 2019-01-14 | Discharge: 2019-01-14 | Disposition: A | Discharge status: Home / Self Care | Payer: Medicare HMO | Source: Ambulatory Visit | Attending: Radiation Oncology | Admitting: Radiation Oncology

## 2019-01-14 ENCOUNTER — Encounter: Payer: Self-pay | Admitting: Radiation Oncology

## 2019-01-14 VITALS — BP 152/67 | HR 46 | Temp 97.6°F | Wt 158.3 lb

## 2019-01-14 DIAGNOSIS — Z7982 Long term (current) use of aspirin: Secondary | ICD-10-CM | POA: Diagnosis not present

## 2019-01-14 DIAGNOSIS — I10 Essential (primary) hypertension: Secondary | ICD-10-CM | POA: Insufficient documentation

## 2019-01-14 DIAGNOSIS — M129 Arthropathy, unspecified: Secondary | ICD-10-CM | POA: Insufficient documentation

## 2019-01-14 DIAGNOSIS — Z79899 Other long term (current) drug therapy: Secondary | ICD-10-CM | POA: Diagnosis not present

## 2019-01-14 DIAGNOSIS — C884 Extranodal marginal zone B-cell lymphoma of mucosa-associated lymphoid tissue [MALT-lymphoma]: Secondary | ICD-10-CM | POA: Insufficient documentation

## 2019-01-14 DIAGNOSIS — R17 Unspecified jaundice: Secondary | ICD-10-CM | POA: Insufficient documentation

## 2019-01-14 DIAGNOSIS — J45909 Unspecified asthma, uncomplicated: Secondary | ICD-10-CM | POA: Insufficient documentation

## 2019-01-14 DIAGNOSIS — I2510 Atherosclerotic heart disease of native coronary artery without angina pectoris: Secondary | ICD-10-CM | POA: Diagnosis not present

## 2019-01-14 NOTE — Consult Note (Signed)
NEW PATIENT EVALUATION  Name: Earl Lyons  MRN: [id number]  Date:   01/14/2019     DOB: February 02, 1946   This 73 y.o. male patient presents to the clinic for initial evaluation of a stage T1 a cutaneous marginal zone lymphoma of the forehead status post shave biopsy.  REFERRING PHYSICIAN: Sharyne Peach, MD  CHIEF COMPLAINT:  Chief Complaint  Patient presents with  . 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   PREVIOUS INVESTIGATIONS:  Pathology report reviewed Clinical notes reviewed PET CT scan reviewed  HPI: Patient is a 73 year old male with history of multiple resections of squamous cell carcinoma from the skin.  He recently presented with a forehead lesion underwent shave biopsy biopsy was positive for marginal cell lymphoma.  He is asymptomatic specifically denies fever chills night sweats or weight loss.  He had a PET CT scan which shows no evidence of malignancy.  He is asymptomatic.  Work-up by medical oncology shows no unusual findings or labs.  He was presented at our weekly tumor conference and recommendation for adjuvant radiation therapy was made.  He is seen today for consultation is doing well.  He is without complaint.  PLANNED TREATMENT REGIMEN: Electron-beam therapy to his forehead  PAST MEDICAL HISTORY:  has a past medical history of Anginal pain ([place]), Arthritis, Asthma, Cancer ([place]), Coronary artery disease, Hypertension, and Vertigo.    PAST SURGICAL HISTORY:  Past Surgical History:  Procedure Laterality Date  . CARDIAC CATHETERIZATION    . EXCISION MASS LOWER EXTREMETIES Right 11/04/2016   Procedure: EXCISION OF MASS  RIGHT THIGH;  Surgeon: Leonie Green, MD;  Location: [facility] ORS;  Service: General;  Laterality: Right;  . HERNIA REPAIR Right    Inguinal Hernia Repair  . KNEE ARTHROSCOPY Right   . SHOULDER ARTHROSCOPY W/ ROTATOR CUFF REPAIR Right   .  TONSILLECTOMY    . VASECTOMY      FAMILY HISTORY: family history includes Colon cancer in his maternal uncle; Emphysema in his mother; Skin cancer in his mother.  SOCIAL HISTORY:  reports that he has never smoked. He has never used smokeless tobacco. He reports that he does not drink alcohol or use drugs.  ALLERGIES: Metoprolol  MEDICATIONS:  Current Outpatient Medications  Medication Sig Dispense Refill  . amLODipine (NORVASC) 5 MG tablet Take 5 mg by mouth every evening.    Marland Kitchen aspirin EC 81 MG tablet Take 81 mg by mouth every evening.     Marland Kitchen atorvastatin (LIPITOR) 40 MG tablet Take 40 mg by mouth every evening.    . cholecalciferol (VITAMIN D) 1000 units tablet Take 1,000 Units by mouth daily.    . fluocinonide cream (LIDEX) 0.05 % Apply topically bid prn    . fluticasone (FLONASE) 50 MCG/ACT nasal spray Place into the nose.    . lamoTRIgine (LAMICTAL) 150 MG tablet Take by mouth.    . lamoTRIgine (LAMICTAL) 25 MG tablet Take 100 mg by mouth daily.     Marland Kitchen lisinopril (ZESTRIL) 20 MG tablet Take by mouth.    . nitroGLYCERIN (NITROSTAT) 0.4 MG SL tablet Place under the tongue.    . sildenafil (VIAGRA) 50 MG tablet Take by mouth.    . triamcinolone ointment (KENALOG) 0.5 % Apply 1 application topically 2 (two) times daily as needed (itching). Applies to inside of ears when itching      No current facility-administered medications for  this encounter.     ECOG PERFORMANCE STATUS:  0 - Asymptomatic  REVIEW OF SYSTEMS: Patient denies any weight loss, fatigue, weakness, fever, chills or night sweats. Patient denies any loss of vision, blurred vision. Patient denies any ringing  of the ears or hearing loss. No irregular heartbeat. Patient denies heart murmur or history of fainting. Patient denies any chest pain or pain radiating to her upper extremities. Patient denies any shortness of breath, difficulty breathing at night, cough or hemoptysis. Patient denies any swelling in the lower legs.  Patient denies any nausea vomiting, vomiting of blood, or coffee ground material in the vomitus. Patient denies any stomach pain. Patient states has had normal bowel movements no significant constipation or diarrhea. Patient denies any dysuria, hematuria or significant nocturia. Patient denies any problems walking, swelling in the joints or loss of balance. Patient denies any skin changes, loss of hair or loss of weight. Patient denies any excessive worrying or anxiety or significant depression. Patient denies any problems with insomnia. Patient denies excessive thirst, polyuria, polydipsia. Patient denies any swollen glands, patient denies easy bruising or easy bleeding. Patient denies any recent infections, allergies or URI. Patient "s visual fields have not changed significantly in recent time.   PHYSICAL EXAM: BP (!) 152/67   Pulse (!) 46   Temp 97.6 F (36.4 C)   Wt 158 lb 4.8 oz (71.8 kg)   BMI 22.08 kg/m  Area of the forehead in the area of resection shows slight nodular thickening secondary to scar tissue.  No evidence of mass or nodularity is noted.  No evidence of adenopathy in the cervical or supraclavicular outer area is noted.  Well-developed well-nourished patient in NAD. HEENT reveals PERLA, EOMI, discs not visualized.  Oral cavity is clear. No oral mucosal lesions are identified. Neck is clear without evidence of cervical or supraclavicular adenopathy. Lungs are clear to A&P. Cardiac examination is essentially unremarkable with regular rate and rhythm without murmur rub or thrill. Abdomen is benign with no organomegaly or masses noted. Motor sensory and DTR levels are equal and symmetric in the upper and lower extremities. Cranial nerves II through XII are grossly intact. Proprioception is intact. No peripheral adenopathy or edema is identified. No motor or sensory levels are noted. Crude visual fields are within normal range.  LABORATORY DATA: Pathology reports have been requested for my  review    RADIOLOGY RESULTS: PET CT scan reviewed showing no evidence of peripheral disease   IMPRESSION: Stage Ia marginal zone lymphoma of cutaneous origin on the forehead status post shave biopsy in 73 year old male  PLAN: At this time I did go ahead with electron-beam therapy to area of initial involvement.  Would plan on delivering 3000 cGy in 15 fractions.  Risks and benefits of treatment including skin reaction possible fatigue all were discussed in detail with the patient.  I have personally set up and ordered CT simulation for later this week.  Patient comprehends my treatment plan well.  I would like to take this opportunity to thank you for allowing me to participate in the care of your patient.Noreene Filbert, MD

## 2019-01-15 ENCOUNTER — Other Ambulatory Visit: Payer: Self-pay

## 2019-01-16 ENCOUNTER — Other Ambulatory Visit: Payer: Self-pay

## 2019-01-16 ENCOUNTER — Ambulatory Visit
Admission: RE | Admit: 2019-01-16 | Discharge: 2019-01-16 | Disposition: A | Discharge status: Home / Self Care | Payer: Medicare HMO | Source: Ambulatory Visit | Attending: Radiation Oncology | Admitting: Radiation Oncology

## 2019-01-16 DIAGNOSIS — Z510 Encounter for antineoplastic radiation therapy: Secondary | ICD-10-CM | POA: Insufficient documentation

## 2019-01-16 DIAGNOSIS — C884 Extranodal marginal zone B-cell lymphoma of mucosa-associated lymphoid tissue [MALT-lymphoma]: Secondary | ICD-10-CM | POA: Diagnosis present

## 2019-01-17 DIAGNOSIS — Z510 Encounter for antineoplastic radiation therapy: Secondary | ICD-10-CM | POA: Diagnosis not present

## 2019-01-23 ENCOUNTER — Ambulatory Visit
Admission: RE | Admit: 2019-01-23 | Discharge: 2019-01-23 | Disposition: A | Discharge status: Home / Self Care | Payer: Medicare HMO | Source: Ambulatory Visit | Attending: Radiation Oncology | Admitting: Radiation Oncology

## 2019-01-23 ENCOUNTER — Other Ambulatory Visit: Payer: Self-pay

## 2019-01-23 DIAGNOSIS — Z510 Encounter for antineoplastic radiation therapy: Secondary | ICD-10-CM | POA: Diagnosis not present

## 2019-01-24 ENCOUNTER — Ambulatory Visit
Admission: RE | Admit: 2019-01-24 | Discharge: 2019-01-24 | Disposition: A | Discharge status: Home / Self Care | Payer: Medicare HMO | Source: Ambulatory Visit | Attending: Radiation Oncology | Admitting: Radiation Oncology

## 2019-01-24 ENCOUNTER — Other Ambulatory Visit: Payer: Self-pay

## 2019-01-24 ENCOUNTER — Ambulatory Visit: Payer: Medicare HMO

## 2019-01-24 DIAGNOSIS — Z510 Encounter for antineoplastic radiation therapy: Secondary | ICD-10-CM | POA: Diagnosis not present

## 2019-01-25 ENCOUNTER — Other Ambulatory Visit: Payer: Self-pay

## 2019-01-25 ENCOUNTER — Ambulatory Visit
Admission: RE | Admit: 2019-01-25 | Discharge: 2019-01-25 | Disposition: A | Discharge status: Home / Self Care | Payer: Medicare HMO | Source: Ambulatory Visit | Attending: Radiation Oncology | Admitting: Radiation Oncology

## 2019-01-25 ENCOUNTER — Ambulatory Visit: Payer: Medicare HMO

## 2019-01-25 DIAGNOSIS — Z510 Encounter for antineoplastic radiation therapy: Secondary | ICD-10-CM | POA: Diagnosis not present

## 2019-01-28 ENCOUNTER — Ambulatory Visit: Payer: Medicare HMO

## 2019-01-28 ENCOUNTER — Ambulatory Visit
Admission: RE | Admit: 2019-01-28 | Discharge: 2019-01-28 | Disposition: A | Discharge status: Home / Self Care | Payer: Medicare HMO | Source: Ambulatory Visit | Attending: Radiation Oncology | Admitting: Radiation Oncology

## 2019-01-28 ENCOUNTER — Other Ambulatory Visit: Payer: Self-pay

## 2019-01-28 DIAGNOSIS — Z510 Encounter for antineoplastic radiation therapy: Secondary | ICD-10-CM | POA: Diagnosis not present

## 2019-01-29 ENCOUNTER — Ambulatory Visit: Payer: Medicare HMO

## 2019-01-29 ENCOUNTER — Other Ambulatory Visit: Payer: Self-pay

## 2019-01-29 ENCOUNTER — Ambulatory Visit
Admission: RE | Admit: 2019-01-29 | Discharge: 2019-01-29 | Disposition: A | Discharge status: Home / Self Care | Payer: Medicare HMO | Source: Ambulatory Visit | Attending: Radiation Oncology | Admitting: Radiation Oncology

## 2019-01-29 DIAGNOSIS — Z510 Encounter for antineoplastic radiation therapy: Secondary | ICD-10-CM | POA: Diagnosis not present

## 2019-01-30 ENCOUNTER — Ambulatory Visit: Payer: Medicare HMO

## 2019-01-30 ENCOUNTER — Ambulatory Visit
Admission: RE | Admit: 2019-01-30 | Discharge: 2019-01-30 | Disposition: A | Discharge status: Home / Self Care | Payer: Medicare HMO | Source: Ambulatory Visit | Attending: Radiation Oncology | Admitting: Radiation Oncology

## 2019-01-30 ENCOUNTER — Other Ambulatory Visit: Payer: Self-pay

## 2019-01-30 DIAGNOSIS — Z510 Encounter for antineoplastic radiation therapy: Secondary | ICD-10-CM | POA: Diagnosis not present

## 2019-01-31 ENCOUNTER — Other Ambulatory Visit: Payer: Self-pay

## 2019-01-31 ENCOUNTER — Ambulatory Visit
Admission: RE | Admit: 2019-01-31 | Discharge: 2019-01-31 | Disposition: A | Discharge status: Home / Self Care | Payer: Medicare HMO | Source: Ambulatory Visit | Attending: Radiation Oncology | Admitting: Radiation Oncology

## 2019-01-31 ENCOUNTER — Ambulatory Visit: Payer: Medicare HMO

## 2019-01-31 DIAGNOSIS — Z510 Encounter for antineoplastic radiation therapy: Secondary | ICD-10-CM | POA: Diagnosis not present

## 2019-02-01 ENCOUNTER — Ambulatory Visit: Payer: Medicare HMO

## 2019-02-01 ENCOUNTER — Ambulatory Visit
Admission: RE | Admit: 2019-02-01 | Discharge: 2019-02-01 | Disposition: A | Discharge status: Home / Self Care | Payer: Medicare HMO | Source: Ambulatory Visit | Attending: Radiation Oncology | Admitting: Radiation Oncology

## 2019-02-01 ENCOUNTER — Other Ambulatory Visit: Payer: Self-pay

## 2019-02-01 DIAGNOSIS — Z510 Encounter for antineoplastic radiation therapy: Secondary | ICD-10-CM | POA: Diagnosis not present

## 2019-02-04 ENCOUNTER — Other Ambulatory Visit: Payer: Self-pay

## 2019-02-04 ENCOUNTER — Ambulatory Visit
Admission: RE | Admit: 2019-02-04 | Discharge: 2019-02-04 | Disposition: A | Discharge status: Home / Self Care | Payer: Medicare HMO | Source: Ambulatory Visit | Attending: Radiation Oncology | Admitting: Radiation Oncology

## 2019-02-04 ENCOUNTER — Ambulatory Visit: Payer: Medicare HMO

## 2019-02-04 DIAGNOSIS — C884 Extranodal marginal zone B-cell lymphoma of mucosa-associated lymphoid tissue [MALT-lymphoma]: Secondary | ICD-10-CM | POA: Insufficient documentation

## 2019-02-04 DIAGNOSIS — Z510 Encounter for antineoplastic radiation therapy: Secondary | ICD-10-CM | POA: Insufficient documentation

## 2019-02-05 ENCOUNTER — Other Ambulatory Visit: Payer: Self-pay

## 2019-02-05 ENCOUNTER — Ambulatory Visit
Admission: RE | Admit: 2019-02-05 | Discharge: 2019-02-05 | Disposition: A | Discharge status: Home / Self Care | Payer: Medicare HMO | Source: Ambulatory Visit | Attending: Radiation Oncology | Admitting: Radiation Oncology

## 2019-02-05 ENCOUNTER — Ambulatory Visit: Payer: Medicare HMO

## 2019-02-05 DIAGNOSIS — Z510 Encounter for antineoplastic radiation therapy: Secondary | ICD-10-CM | POA: Diagnosis not present

## 2019-02-06 ENCOUNTER — Ambulatory Visit
Admission: RE | Admit: 2019-02-06 | Discharge: 2019-02-06 | Disposition: A | Discharge status: Home / Self Care | Payer: Medicare HMO | Source: Ambulatory Visit | Attending: Radiation Oncology | Admitting: Radiation Oncology

## 2019-02-06 ENCOUNTER — Other Ambulatory Visit: Payer: Self-pay

## 2019-02-06 DIAGNOSIS — Z510 Encounter for antineoplastic radiation therapy: Secondary | ICD-10-CM | POA: Diagnosis not present

## 2019-02-07 ENCOUNTER — Other Ambulatory Visit: Payer: Self-pay

## 2019-02-07 ENCOUNTER — Ambulatory Visit
Admission: RE | Admit: 2019-02-07 | Discharge: 2019-02-07 | Disposition: A | Discharge status: Home / Self Care | Payer: Medicare HMO | Source: Ambulatory Visit | Attending: Radiation Oncology | Admitting: Radiation Oncology

## 2019-02-07 DIAGNOSIS — Z510 Encounter for antineoplastic radiation therapy: Secondary | ICD-10-CM | POA: Diagnosis not present

## 2019-02-08 ENCOUNTER — Other Ambulatory Visit: Payer: Self-pay

## 2019-02-08 ENCOUNTER — Ambulatory Visit
Admission: RE | Admit: 2019-02-08 | Discharge: 2019-02-08 | Disposition: A | Discharge status: Home / Self Care | Payer: Medicare HMO | Source: Ambulatory Visit | Attending: Radiation Oncology | Admitting: Radiation Oncology

## 2019-02-08 DIAGNOSIS — Z510 Encounter for antineoplastic radiation therapy: Secondary | ICD-10-CM | POA: Diagnosis not present

## 2019-02-11 ENCOUNTER — Ambulatory Visit
Admission: RE | Admit: 2019-02-11 | Discharge: 2019-02-11 | Disposition: A | Discharge status: Home / Self Care | Payer: Medicare HMO | Source: Ambulatory Visit | Attending: Radiation Oncology | Admitting: Radiation Oncology

## 2019-02-11 ENCOUNTER — Other Ambulatory Visit: Payer: Self-pay

## 2019-02-11 DIAGNOSIS — Z510 Encounter for antineoplastic radiation therapy: Secondary | ICD-10-CM | POA: Diagnosis not present

## 2019-02-12 ENCOUNTER — Ambulatory Visit
Admission: RE | Admit: 2019-02-12 | Discharge: 2019-02-12 | Disposition: A | Discharge status: Home / Self Care | Payer: Medicare HMO | Source: Ambulatory Visit | Attending: Radiation Oncology | Admitting: Radiation Oncology

## 2019-02-12 ENCOUNTER — Other Ambulatory Visit: Payer: Self-pay

## 2019-02-12 DIAGNOSIS — Z510 Encounter for antineoplastic radiation therapy: Secondary | ICD-10-CM | POA: Diagnosis not present

## 2019-03-12 ENCOUNTER — Other Ambulatory Visit: Payer: Self-pay

## 2019-03-13 ENCOUNTER — Encounter: Payer: Self-pay | Admitting: Radiation Oncology

## 2019-03-13 ENCOUNTER — Other Ambulatory Visit: Payer: Self-pay

## 2019-03-13 ENCOUNTER — Ambulatory Visit
Admission: RE | Admit: 2019-03-13 | Discharge: 2019-03-13 | Disposition: A | Discharge status: Home / Self Care | Payer: Medicare HMO | Source: Ambulatory Visit | Attending: Radiation Oncology | Admitting: Radiation Oncology

## 2019-03-13 VITALS — BP 140/66 | HR 48 | Temp 97.7°F | Resp 18 | Wt 161.8 lb

## 2019-03-13 DIAGNOSIS — Z923 Personal history of irradiation: Secondary | ICD-10-CM | POA: Insufficient documentation

## 2019-03-13 DIAGNOSIS — Z8572 Personal history of non-Hodgkin lymphomas: Secondary | ICD-10-CM | POA: Diagnosis not present

## 2019-03-13 DIAGNOSIS — C884 Extranodal marginal zone B-cell lymphoma of mucosa-associated lymphoid tissue [MALT-lymphoma]: Secondary | ICD-10-CM

## 2019-03-13 NOTE — Progress Notes (Signed)
Radiation Oncology Follow up Note  Name: Earl Lyons   Date:   03/13/2019 MRN:  [id number] DOB: 02-23-1946    This 73 y.o. male presents to the clinic today for 1 month follow-up status post electron-beam therapy to his anterior scalp for cutaneous marginal zone lymphoma.  REFERRING PROVIDER: Sharyne Peach, MD  HPI: Patient is a 73 year old male now at 1 month having completed electron-beam therapy to his anterior forehead for cutaneous marginal cell lymphoma status post shave biopsy.  Seen today in follow-up he is doing well specifically denies any areas of pain any new skin lesions noted.  Area of his forehead looks clear does have some patchy other lesions which appear to be early.  Solar keratoses.  COMPLICATIONS OF TREATMENT: none  FOLLOW UP COMPLIANCE: keeps appointments   PHYSICAL EXAM:  BP 140/66   Pulse (!) 48   Temp 97.7 F (36.5 C)   Resp 18   Wt 161 lb 12.8 oz (73.4 kg)   BMI 22.57 kg/m  Area of his forehead is completely resolved.  No evidence of cervical or supraclavicular adenopathy is appreciated.  Well-developed well-nourished patient in NAD. HEENT reveals PERLA, EOMI, discs not visualized.  Oral cavity is clear. No oral mucosal lesions are identified. Neck is clear without evidence of cervical or supraclavicular adenopathy. Lungs are clear to A&P. Cardiac examination is essentially unremarkable with regular rate and rhythm without murmur rub or thrill. Abdomen is benign with no organomegaly or masses noted. Motor sensory and DTR levels are equal and symmetric in the upper and lower extremities. Cranial nerves II through XII are grossly intact. Proprioception is intact. No peripheral adenopathy or edema is identified. No motor or sensory levels are noted. Crude visual fields are within normal range.  RADIOLOGY RESULTS: No current films to review  PLAN: Patient is doing well with no evidence of disease.  I am pleased with his overall progress.  I have asked to see him  back in 6 months for follow-up.  He continues close follow-up care with Dr. Mike Gip.  Patient knows to call with any concerns.  I would like to take this opportunity to thank you for allowing me to participate in the care of your patient.Noreene Filbert, MD

## 2019-04-04 ENCOUNTER — Other Ambulatory Visit: Payer: Self-pay

## 2019-04-08 ENCOUNTER — Other Ambulatory Visit: Payer: Medicare HMO

## 2019-04-08 ENCOUNTER — Other Ambulatory Visit: Payer: Self-pay

## 2019-04-08 ENCOUNTER — Inpatient Hospital Stay: Payer: Medicare HMO | Attending: Hematology and Oncology

## 2019-04-08 DIAGNOSIS — Z79899 Other long term (current) drug therapy: Secondary | ICD-10-CM | POA: Diagnosis not present

## 2019-04-08 DIAGNOSIS — C8591 Non-Hodgkin lymphoma, unspecified, lymph nodes of head, face, and neck: Secondary | ICD-10-CM | POA: Insufficient documentation

## 2019-04-08 DIAGNOSIS — D696 Thrombocytopenia, unspecified: Secondary | ICD-10-CM | POA: Insufficient documentation

## 2019-04-08 DIAGNOSIS — L570 Actinic keratosis: Secondary | ICD-10-CM | POA: Insufficient documentation

## 2019-04-08 DIAGNOSIS — C884 Extranodal marginal zone B-cell lymphoma of mucosa-associated lymphoid tissue [MALT-lymphoma]: Secondary | ICD-10-CM

## 2019-04-08 DIAGNOSIS — R17 Unspecified jaundice: Secondary | ICD-10-CM

## 2019-04-08 LAB — COMPREHENSIVE METABOLIC PANEL
ALT: 22 U/L (ref 0–44)
AST: 30 U/L (ref 15–41)
Albumin: 4 g/dL (ref 3.5–5.0)
Alkaline Phosphatase: 36 U/L — ABNORMAL LOW (ref 38–126)
Anion gap: 6 (ref 5–15)
BUN: 18 mg/dL (ref 8–23)
CO2: 28 mmol/L (ref 22–32)
Calcium: 8.9 mg/dL (ref 8.9–10.3)
Chloride: 102 mmol/L (ref 98–111)
Creatinine, Ser: 1.02 mg/dL (ref 0.61–1.24)
GFR calc Af Amer: >60 mL/min (ref >60)
GFR calc non Af Amer: >60 mL/min (ref >60)
Glucose, Bld: 108 mg/dL — ABNORMAL HIGH (ref 70–99)
Potassium: 4.5 mmol/L (ref 3.5–5.1)
Sodium: 136 mmol/L (ref 135–145)
Total Bilirubin: 1.3 mg/dL — ABNORMAL HIGH (ref 0.3–1.2)
Total Protein: 6.9 g/dL (ref 6.5–8.1)

## 2019-04-08 LAB — CBC WITH DIFFERENTIAL/PLATELET
Abs Immature Granulocytes: 0.02 10*3/uL (ref 0.00–0.07)
Basophils Absolute: 0.1 10*3/uL (ref 0.0–0.1)
Basophils Relative: 1 %
Eosinophils Absolute: 0.1 10*3/uL (ref 0.0–0.5)
Eosinophils Relative: 2 %
HCT: 42.4 % (ref 39.0–52.0)
Hemoglobin: 14 g/dL (ref 13.0–17.0)
Immature Granulocytes: 0 %
Lymphocytes Relative: 28 %
Lymphs Abs: 1.9 10*3/uL (ref 0.7–4.0)
MCH: 29.3 pg (ref 26.0–34.0)
MCHC: 33 g/dL (ref 30.0–36.0)
MCV: 88.7 fL (ref 80.0–100.0)
Monocytes Absolute: 0.7 10*3/uL (ref 0.1–1.0)
Monocytes Relative: 11 %
Neutro Abs: 4 10*3/uL (ref 1.7–7.7)
Neutrophils Relative %: 58 %
Platelets: 125 10*3/uL — ABNORMAL LOW (ref 150–400)
RBC: 4.78 MIL/uL (ref 4.22–5.81)
RDW: 13.2 % (ref 11.5–15.5)
WBC: 6.9 10*3/uL (ref 4.0–10.5)
nRBC: 0 % (ref 0.0–0.2)

## 2019-04-08 LAB — LACTATE DEHYDROGENASE: LDH: 149 U/L (ref 98–192)

## 2019-04-09 ENCOUNTER — Encounter: Payer: Self-pay | Admitting: Hematology and Oncology

## 2019-04-09 NOTE — Progress Notes (Signed)
Patient states he has no concerns other than discussing what his diagnosis/prognosis is.

## 2019-04-09 NOTE — Progress Notes (Signed)
[name]  [address], [address] [place], [place] [address] Phone: [phone]  Fax: [phone]   [place] Office Visit:  04/10/2019  Referring physician: Sharyne Peach, MD  I connected with Earl Lyons on 04/10/19 at 9:35 AM by videoconferencing and verified that I was speaking with the correct person using 2 identifiers.  The patient was at home.  I discussed the limitations, risk, security and privacy concerns of performing an evaluation and management service by videoconferencing and the availability of in person appointments.  I also discussed with the patient that there may be a patient responsible charge related to this service.  The patient expressed understanding and agreed to proceed.   Chief Complaint: Earl Lyons is a 74 y.o. 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   HPI: The patient was last seen in the medical oncology clinic on 01/07/2019. At that time, he was doing well.  He had gained 2 pounds.  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  PET scan on 01/03/2019 revealed no evidence of malignancy.  Exam revealed no adenopathy or hepatosplenomegaly.  Bilirubin was 1.6 (direct 0.2).  He was seen for consultation by Dr. Baruch Gouty, radiation oncologist, on 01/14/2019.  Plan was for electron beam therapy of 3000 cGy in 15 fractions.  He received treatment from 01/23/2019 - 02/12/2019.  He was seen in follow-up on 03/13/2019 by Dr Baruch Gouty.  The area on his forehead had completely resolved.  He has follow-up in 6 months.  He was seen in consultation by Gayland Curry, PA, at the [facility] on 03/05/2019 for for seizure-like activity.  He had transient alteration of awareness without LOC and concerns for non convulsive seizures.  His altered awareness started in 02/2016.  He has seen  many other doctors with no diagnosis. Last episode was 10/24/2018.  He was taking Lamictal.   CBC followed 03/06/2019:  Hematocrit 43.0, hemoglobin 14.1, MCV 89.0, platelets 121,000, WBC 6600. Bilirubin 1.7. 04/08/2019:  Hematocrit 42.2, hemoglobin 14.0, MCV 88.7, platelets 125,000, WBC 6900. Bilirubin 1.3. LDH 149.  During the interim, he had some skin redness after his radiation, but notes no other side effects.  Overall, he is doing well.    Past Medical History:  Diagnosis Date  . Anginal pain ([place])    In the past--not currently  . Arthritis    Osteoarthritis  . Asthma    IN THE PAST (NO INHALERS IN THE PAST FIVE YEARS)  . Cancer ([place])    Melanomas Skin Cancer  . Coronary artery disease   . Hypertension   . Vertigo     Past Surgical History:  Procedure Laterality Date  . CARDIAC CATHETERIZATION    . EXCISION MASS LOWER EXTREMETIES Right 11/04/2016   Procedure: EXCISION OF MASS  RIGHT THIGH;  Surgeon: Leonie Green, MD;  Location: [facility] ORS;  Service: General;  Laterality: Right;  . HERNIA REPAIR Right    Inguinal Hernia Repair  . KNEE ARTHROSCOPY Right   . SHOULDER ARTHROSCOPY W/ ROTATOR CUFF REPAIR Right   . TONSILLECTOMY    . VASECTOMY      Family History  Problem Relation Age of Onset  . Skin cancer Mother   . Emphysema Mother   . Colon cancer Maternal Uncle     Social History:  reports that he has never smoked. He has never used smokeless  tobacco. He reports that he does not drink alcohol or use drugs. He states that he underwent a radiation treatment to treat acne as a child. He use to run in school; more recently he has been biking for exercise. 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 He has lived in [place] since 2013. He is married. He has 3 daughters. The patient is alone today.  Participants in the patient's visit and their role in the encounter included the patient, Park Meo, and Orlene Och, Therapist, sports. The intake  visit was provided by Samul Dada, scribe, Orlene Och, RN.    Allergies:  Allergies  Allergen Reactions  . Metoprolol Other (See Comments)    bradycardia    Current Medications: Current Outpatient Medications  Medication Sig Dispense Refill  . amLODipine (NORVASC) 5 MG tablet Take 5 mg by mouth every evening.    Marland Kitchen aspirin EC 81 MG tablet Take 81 mg by mouth every evening.     Marland Kitchen atorvastatin (LIPITOR) 40 MG tablet Take 40 mg by mouth every evening.    . cholecalciferol (VITAMIN D) 1000 units tablet Take 1,000 Units by mouth daily.    . hydrocortisone 2.5 % cream Apply topically 2 (two) times daily.    Marland Kitchen lamoTRIgine (LAMICTAL) 150 MG tablet Take by mouth.    . lamoTRIgine (LAMICTAL) 25 MG tablet Take 100 mg by mouth daily.     Marland Kitchen lisinopril (ZESTRIL) 20 MG tablet Take by mouth.    . psyllium (METAMUCIL) 58.6 % packet Take by mouth daily. One teaspoon a day    . sildenafil (VIAGRA) 50 MG tablet Take by mouth.    . fluocinonide cream (LIDEX) 0.05 % Apply topically bid prn    . fluticasone (FLONASE) 50 MCG/ACT nasal spray Place into the nose.    . nitroGLYCERIN (NITROSTAT) 0.4 MG SL tablet Place under the tongue.    . triamcinolone ointment (KENALOG) 0.5 % Apply 1 application topically 2 (two) times daily as needed (itching). Applies to inside of ears when itching      No current facility-administered medications for this visit.    Review of Systems  Constitutional: Negative.  Negative for chills, diaphoresis, fever, malaise/fatigue and weight loss (weight up 2 lbs).       Feels "good".  HENT: Negative.  Negative for congestion, ear pain, hearing loss, nosebleeds, sinus pain and sore throat.   Eyes: Negative.  Negative for blurred vision, double vision, photophobia and pain.       No change in vision.  Due for vision exam.  Respiratory: Negative.  Negative for cough, hemoptysis, shortness of breath and wheezing.   Cardiovascular: Negative.  Negative for chest pain, palpitations,  orthopnea and leg swelling.       Baseline asymptomatic bradycardia.  Gastrointestinal: Negative.  Negative for abdominal pain, blood in stool, constipation, diarrhea, melena, nausea and vomiting.       Colonoscopy due 2021.  Genitourinary: Negative.  Negative for dysuria, frequency, hematuria and urgency.  Musculoskeletal: Positive for joint pain (arthritis in hands, knees, R foot, L shoulder). Negative for back pain, myalgias and neck pain.  Skin: Negative for rash.       Slight skin redness after radiation.  Neurological: Positive for weakness (due to seizure medication; stable). Negative for dizziness, tingling, tremors, sensory change, speech change, focal weakness and headaches.  Endo/Heme/Allergies: Negative.  Does not bruise/bleed easily.  Psychiatric/Behavioral: Negative.  Negative for depression and memory loss. The patient is not nervous/anxious.  Performance status (ECOG): 0  Vitals There were no vitals taken for this visit.   Physical Exam  Constitutional: He is oriented to person, place, and time. He appears well-developed and well-nourished. No distress.  HENT:  Head: Normocephalic and atraumatic.  Short gray hair and beard.  Eyes: Conjunctivae and EOM are normal. No scleral icterus.  Glasses.  Neurological: He is alert and oriented to person, place, and time.  Skin: He is not diaphoretic.  Psychiatric: He has a normal mood and affect. His behavior is normal. Judgment and thought content normal.  Nursing note reviewed.   Appointment on 04/08/2019  Component Date Value Ref Range Status  . LDH 04/08/2019 149  98 - 192 U/L Final   Performed at [facility], [address], [place], [place] [address]  . Sodium 04/08/2019 136  135 - 145 mmol/L Final  . Potassium 04/08/2019 4.5  3.5 - 5.1 mmol/L Final  . Chloride 04/08/2019 102  98 - 111 mmol/L Final  . CO2 04/08/2019 28  22 - 32 mmol/L Final  . Glucose, Bld 04/08/2019 108* 70 - 99 mg/dL Final  . BUN  04/08/2019 18  8 - 23 mg/dL Final  . Creatinine, Ser 04/08/2019 1.02  0.61 - 1.24 mg/dL Final  . Calcium 04/08/2019 8.9  8.9 - 10.3 mg/dL Final  . Total Protein 04/08/2019 6.9  6.5 - 8.1 g/dL Final  . Albumin 04/08/2019 4.0  3.5 - 5.0 g/dL Final  . AST 04/08/2019 30  15 - 41 U/L Final  . ALT 04/08/2019 22  0 - 44 U/L Final  . Alkaline Phosphatase 04/08/2019 36* 38 - 126 U/L Final  . Total Bilirubin 04/08/2019 1.3* 0.3 - 1.2 mg/dL Final  . GFR calc non Af Amer 04/08/2019 >60  >60 mL/min Final  . GFR calc Af Amer 04/08/2019 >60  >60 mL/min Final  . Anion gap 04/08/2019 6  5 - 15 Final   Performed at [facility], [address], [place], [place] [address]  . WBC 04/08/2019 6.9  4.0 - 10.5 K/uL Final  . RBC 04/08/2019 4.78  4.22 - 5.81 MIL/uL Final  . Hemoglobin 04/08/2019 14.0  13.0 - 17.0 g/dL Final  . HCT 04/08/2019 42.4  39.0 - 52.0 % Final  . MCV 04/08/2019 88.7  80.0 - 100.0 fL Final  . MCH 04/08/2019 29.3  26.0 - 34.0 pg Final  . MCHC 04/08/2019 33.0  30.0 - 36.0 g/dL Final  . RDW 04/08/2019 13.2  11.5 - 15.5 % Final  . Platelets 04/08/2019 125* 150 - 400 K/uL Final  . nRBC 04/08/2019 0.0  0.0 - 0.2 % Final  . Neutrophils Relative % 04/08/2019 58  % Final  . Neutro Abs 04/08/2019 4.0  1.7 - 7.7 K/uL Final  . Lymphocytes Relative 04/08/2019 28  % Final  . Lymphs Abs 04/08/2019 1.9  0.7 - 4.0 K/uL Final  . Monocytes Relative 04/08/2019 11  % Final  . Monocytes Absolute 04/08/2019 0.7  0.1 - 1.0 K/uL Final  . Eosinophils Relative 04/08/2019 2  % Final  . Eosinophils Absolute 04/08/2019 0.1  0.0 - 0.5 K/uL Final  . Basophils Relative 04/08/2019 1  % Final  . Basophils Absolute 04/08/2019 0.1  0.0 - 0.1 K/uL Final  . Immature Granulocytes 04/08/2019 0  % Final  . Abs Immature Granulocytes 04/08/2019 0.02  0.00 - 0.07 K/uL Final   Performed at [facility], [address] [place], [place] [address]  Assessment:  JAROM ROSENSTOCK is a 73 y.o. male with a  clinical T1a cutaneous marginal zone lymphomaof the forehead s/p shave biopsy on08/20/2020. 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  Work-up on 12/28/2018 revealed a hematocrit 44.6, hemoglobin 15.0, MCV 88.3, platelets 133,000, and WBC 7200 with an ANC 4,800. LDH was 146. Bilirubin was 1.6.  SPEP, hepatitis B core antibody total, hepatitis B surface antigen, and hepatitis C antibody were negative.  PET scan on 01/03/2019 revealed no radiographic evidence of malignancy.   He received electron beam therapy of 3000 cGy from 01/23/2019 - 02/12/2019.  Symptomatically, he is doing well.  He denies any concerns.  Plan: 1.   Review labs from 04/08/2019. 2.   Clinical stage T1aNxMx cutaneous marginal zone lymphoma Patient presented with disease confined to his forehead.             PET scan reveals no evidence of lymphoma.             He has received electron beam therapy (completed 02/12/2019).  Begin surveillance. 3.   Mild thrombocytopenia             Platelet count is 125,000.             He has had a mild thrombocytopenia dating back to 10/25/2016.             Platelet count has ranged between 102,000 - 133,000.             No evidence of pseudothrombocytopenia.  Work-up is negative.  Continue to monitor. 4.   Mildly elevated bilirubin.             Bilirubin was 1.3 on 04/08/2019.  Bilirubin was 1.6 on 12/28/2018 and 01/07/2019.             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 5.   RTC in 6 months for MD assessment and labs (CBC with diff, CMP, LDH).  I discussed the assessment and treatment plan with the patient.  The patient was provided an opportunity to ask questions and all were answered.  The patient agreed with the plan and  demonstrated an understanding of the instructions.  The patient was advised to call back if the symptoms worsen or if the condition fails to improve as anticipated.    Lequita Asal, MD, PhD    04/10/2019, 9:45 AM  I, Samul Dada, am acting as a scribe for Lequita Asal, MD.  I, Monette Mike Gip, MD, have reviewed the above documentation for accuracy and completeness, and I agree with the above.

## 2019-04-10 ENCOUNTER — Encounter: Payer: Self-pay | Admitting: Hematology and Oncology

## 2019-04-10 ENCOUNTER — Inpatient Hospital Stay (HOSPITAL_BASED_OUTPATIENT_CLINIC_OR_DEPARTMENT_OTHER): Payer: Medicare HMO | Admitting: Hematology and Oncology

## 2019-04-10 ENCOUNTER — Other Ambulatory Visit: Payer: Medicare HMO

## 2019-04-10 DIAGNOSIS — D696 Thrombocytopenia, unspecified: Secondary | ICD-10-CM

## 2019-04-10 DIAGNOSIS — R17 Unspecified jaundice: Secondary | ICD-10-CM

## 2019-04-10 DIAGNOSIS — C884 Extranodal marginal zone B-cell lymphoma of mucosa-associated lymphoid tissue [MALT-lymphoma]: Secondary | ICD-10-CM

## 2019-05-07 ENCOUNTER — Telehealth: Payer: Self-pay

## 2019-05-07 DIAGNOSIS — C884 Extranodal marginal zone B-cell lymphoma of mucosa-associated lymphoid tissue [MALT-lymphoma]: Secondary | ICD-10-CM

## 2019-05-07 NOTE — Telephone Encounter (Signed)
Survivorship Care Plan visit completed.  Treatment summary reviewed and mailed to patient.  ASCO answers booklet reviewed and mailed to patient.  CARE program and Cancer Transitions discussed with patient along with other resources cancer center offers to patients and caregivers.  Patient verbalized understanding.  Pt in agreement for APP to do a Virtual visit in 2 weeks to review SCP packet and discuss post cancer care.  SCP packet mailed

## 2019-05-10 ENCOUNTER — Encounter: Payer: Self-pay | Admitting: Oncology

## 2019-05-10 NOTE — Telephone Encounter (Signed)
Thank you rosa!  Faythe Casa, NP 05/10/2019 11:32 AM

## 2019-05-12 ENCOUNTER — Encounter: Payer: Self-pay | Admitting: Oncology

## 2019-05-21 ENCOUNTER — Inpatient Hospital Stay: Payer: Medicare HMO | Attending: Oncology | Admitting: Oncology

## 2019-05-21 DIAGNOSIS — C884 Extranodal marginal zone B-cell lymphoma of mucosa-associated lymphoid tissue [MALT-lymphoma]: Secondary | ICD-10-CM

## 2019-05-21 NOTE — Progress Notes (Addendum)
Survivorship Clinic Consult Note [facility]  [phone] [phone]  CLINIC:  Survivorship  REASON FOR VISIT:  Routine follow-up post-treatment for a recent history of marginal zone lymphoma  I connected with Earl Lyons on 06/07/19 at  1:30 PM EST by telephone visit and verified that I am speaking with the correct person using two identifiers.   I discussed the limitations, risks, security and privacy concerns of performing an evaluation and management service by telemedicine and the availability of in-person appointments. I also discussed with the patient that there may be a patient responsible charge related to this service. The patient expressed understanding and agreed to proceed.   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  He recently presented with a forehead lesion underwent shave biopsy biopsy was positive for marginal cell lymphoma.  He is asymptomatic specifically denies fever chills night sweats or weight loss.  He had a PET CT scan which shows no evidence of malignancy.  He is asymptomatic.  Work-up by medical oncology shows no unusual findings or labs.  He was presented at our weekly tumor conference and recommendation for adjuvant radiation therapy was made.  He is seen today for consultation is doing well.  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  Since completing treatment he reports feeling well and denies specific complaints.  Past medical, surgical, family, social history were updated and reviewed as below.  Allergies and current medications were also reviewed and updated as below.  REVIEW OF SYSTEMS:  Review of Systems  Constitutional: Negative.  Negative for appetite change, chills, fatigue and fever.  HENT:  Negative.  Negative for hearing loss, lump/mass, mouth sores and nosebleeds.   Eyes: Negative.  Negative for eye problems.  Respiratory: Negative for cough, hemoptysis and shortness of breath.   Cardiovascular: Negative.  Negative for chest pain and leg swelling.  Gastrointestinal: Negative.  Negative for abdominal pain, blood in stool, constipation, diarrhea, nausea and vomiting.  Endocrine: Negative.  Negative for hot flashes.  Genitourinary: Negative.  Negative for bladder incontinence, difficulty urinating, dysuria, frequency and hematuria.   Musculoskeletal: Negative.  Negative for back pain, flank pain, gait problem and myalgias.  Skin: Negative.  Negative for itching and rash.  Neurological: Negative.  Negative for dizziness, gait problem, headaches, light-headedness and numbness.  Hematological: Negative.  Negative for adenopathy.  Psychiatric/Behavioral: Negative for confusion. The patient is not nervous/anxious.     ONCOLOGY TREATMENT TEAM:  1. Medical Oncologist: Dr. Mike Gip 2. Radiation Oncologist: Dr. Donella Stade    PAST MEDICAL/SURGICAL HISTORY:  Past Medical History:  Diagnosis Date   Anginal pain ([place])    In the past--not currently   Arthritis    Osteoarthritis   Asthma    IN THE PAST (NO INHALERS IN THE PAST FIVE YEARS)   Cancer ([place])  Melanomas Skin Cancer   Coronary artery disease    Hypertension    Vertigo    Past  Surgical History:  Procedure Laterality Date   CARDIAC CATHETERIZATION     EXCISION MASS LOWER EXTREMETIES Right 11/04/2016   Procedure: EXCISION OF MASS  RIGHT THIGH;  Surgeon: Leonie Green, MD;  Location: [facility] ORS;  Service: General;  Laterality: Right;   HERNIA REPAIR Right    Inguinal Hernia Repair   KNEE ARTHROSCOPY Right    SHOULDER ARTHROSCOPY W/ ROTATOR CUFF REPAIR Right    TONSILLECTOMY     VASECTOMY       ALLERGIES:  Allergies  Allergen Reactions   Metoprolol Other (See Comments)    bradycardia    CURRENT MEDICATIONS:  Outpatient Encounter Medications as of 05/21/2019  Medication Sig Note   amLODipine (NORVASC) 5 MG tablet Take 5 mg by mouth every evening.    aspirin EC 81 MG tablet Take 81 mg by mouth every evening.  10/24/2016: Will stop prior to procedure   atorvastatin (LIPITOR) 40 MG tablet Take 40 mg by mouth every evening.    cholecalciferol (VITAMIN D) 1000 units tablet Take 1,000 Units by mouth daily.    fluocinonide cream (LIDEX) 0.05 % Apply topically bid prn 04/09/2019: As needed   fluticasone (FLONASE) 50 MCG/ACT nasal spray Place into the nose. 04/09/2019: As needed   hydrocortisone 2.5 % cream Apply topically 2 (two) times daily.    lamoTRIgine (LAMICTAL) 150 MG tablet Take by mouth.    lamoTRIgine (LAMICTAL) 25 MG tablet Take 100 mg by mouth daily.     lisinopril (ZESTRIL) 20 MG tablet Take by mouth.    nitroGLYCERIN (NITROSTAT) 0.4 MG SL tablet Place under the tongue. 04/09/2019: PRN   psyllium (METAMUCIL) 58.6 % packet Take by mouth daily. One teaspoon a day    sildenafil (VIAGRA) 50 MG tablet Take by mouth.    triamcinolone ointment (KENALOG) 0.5 % Apply 1 application topically 2 (two) times daily as needed (itching). Applies to inside of ears when itching  04/09/2019: PRN   No facility-administered encounter medications on file as of 05/21/2019.    ONCOLOGIC FAMILY HISTORY:  Family History  Problem Relation Age of Onset    Skin cancer Mother    Emphysema Mother    Colon cancer Maternal Uncle     GENETIC COUNSELING/TESTING: None  SOCIAL HISTORY:  Social History   Socioeconomic History   Marital status: Married    Spouse name: Not on file   Number of children: Not on file   Years of education: Not on file   Highest education level: Not on file  Occupational History   Not on file  Tobacco Use   Smoking status: Never Smoker   Smokeless tobacco: Never Used  Substance and Sexual Activity   Alcohol use: No   Drug use: No   Sexual activity: Not on file  Other Topics Concern   Not on file  Social History Narrative   Not on file   Social Determinants of Health   Financial Resource Strain:    Difficulty of Paying Living Expenses: Not on file  Food Insecurity:    Worried About [place] in the Last Year: Not on file   Ran Out of Food in the Last Year: Not on file  Transportation Needs:    Lack of Transportation (Medical): Not on file   Lack of Transportation (Non-Medical): Not on file  Physical Activity:    Days of Exercise per  Week: Not on file   Minutes of Exercise per Session: Not on file  Stress:    Feeling of Stress : Not on file  Social Connections:    Frequency of Communication with Friends and Family: Not on file   Frequency of Social Gatherings with Friends and Family: Not on file   Attends Religious Services: Not on file   Active Member of Clubs or Organizations: Not on file   Attends Archivist Meetings: Not on file   Marital Status: Not on file    PHYSICAL EXAMINATION:  Vital Signs:  There were no vitals filed for this visit. There were no vitals filed for this visit.  Physical Exam  Limited d/t virtual visit  LABORATORY DATA:  Lab Results  Component Value Date   WBC 6.9 04/08/2019   HGB 14.0 04/08/2019   HCT 42.4 04/08/2019   MCV 88.7 04/08/2019   PLT 125 (L) 04/08/2019     Chemistry      Component Value Date/Time    NA 136 04/08/2019 1133   K 4.5 04/08/2019 1133   CL 102 04/08/2019 1133   CO2 28 04/08/2019 1133   BUN 18 04/08/2019 1133   CREATININE 1.02 04/08/2019 1133      Component Value Date/Time   CALCIUM 8.9 04/08/2019 1133   ALKPHOS 36 (L) 04/08/2019 1133   AST 30 04/08/2019 1133   ALT 22 04/08/2019 1133   BILITOT 1.3 (H) 04/08/2019 1133      DIAGNOSTIC IMAGING:  01/03/2019  IMPRESSION: Negative.  No radiographic evidence of malignancy.  ASSESSMENT AND PLAN:  MrDareion Grieger is a pleasant 74 y.o. male who presents to survivorship clinic for initial meeting and routine follow-up post completion of treatment for marginal zone lymphoma.  He received electron-beam therapy to his anterior scalp for cutaneous marginal zone lymphoma which he completed back in November 2020.  Most recent imaging revealed no evidence of disease.  1.  Non-Hodgkin's lymphoma:  Mr. Welsh is continuing to recover from definitive treatment for non-Hodgkin's lymphoma.  Today, NCCN guidelines were reviewed for continued surveillance of lymphoma may include physical exam with blood work and imaging as clinically indicated. We discussed that the goal of these visits is to detect any recurrence as early as possible and that the purpose of the surveillance visits is to look for signs that the cancer may have come back.  I advised that if patient becomes symptomatic prior to these visits, to please call the cancer center so that a visit can be arranged sooner. Today, a comprehensive survivorship care plan and treatment summary was reviewed with the patient detailing diagnosis, treatment course, potential late and long-term side effects of treatments, appropriate follow-up care with recommendations for the future, and patient education resources.  A copy of this care plan, along with a letter will be sent to the patient's primary care provider via mail/\in basket.  #. Problem(s) at Visit: None  #. Reducing risk of cancer recurrence:  Today, we discussed the importance of adopting healthy behaviors such as not smoking, eating well, getting regular physical activity, and staying at a healthy weight might reduce the risk of lymphoma recurrence but unfortunately it is not yet clear if there are things that will help.  However, we know that many of these have positive effects on your health that can extend beyond risk of lymphoma or other cancers.  We discussed that a normal BMI is 18.5-24.9 kg/m.  #. Smoking cessation: We discussed that smoking increases the risk  of many cancers including some of the second cancers seen in people who have had lymphoma.  Patient is currently tobacco free and is committed to staying so.    #. Cancer screening: We discussed the importance of regular cancer screenings and I advised patient that lymphoma survivors do have a somewhat higher risk of second cancers as opposed to the general population.  We discussed that lymphoma survivors have an increased risk of: Melanoma, lung cancer, kidney cancer, colon cancer, cancers of the head and neck, thyroid cancer, bone and soft tissue cancers, leukemia and myelodysplastic syndrome/MDS, bladder cancer, Hodgkin disease, and Kaposi sarcoma.  Information and recommendations are listed in the patient's comprehensive care plan/treatment summary and were reviewed with the patient in detail.  I encouraged patient to maintain a relationship with a primary care provider for continued cancer screenings as the screening recommendations frequently change and a primary care provider is most up-to-date on these recommendations.  #. Health maintenance and wellness promotion: Mr. Unangst was encouraged to consume 5-7 servings of fruits and vegetables per day. We reviewed the "Nutrition Rainbow" handout, as well as the handout "Take Control of Your Health and Reduce Your Cancer Risk" from the [facility].  He was also encouraged to engage in moderate to vigorous exercise for  30 minutes per day most days of the week. We discussed the Care Program, which is designed for cancer survivors to help them become more physically fit after cancer treatments.  I advised to limit alcohol consumption and abstain from tobacco use as this has positive effects on overall health and well being.      #. Support services/counseling: It is not uncommon for this period of the patient's cancer care trajectory to be one of many emotions and stressors.  We discussed the cancer transitions program which is a sick group series designed for patients after they have completed treatment.  Patient was encouraged to take advantage of her many support services and groups and/or counseling to cope in life is a cancer survivor.  Support was provided today through active listening and expressive supportive counseling.  Information regarding the support services was provided and patient was encouraged to contact me with any questions or to provide assistance in enrolling in these programs.   Dispo:   -Return to cancer center on 10/08/19 for follow-up with Dr. Mike Gip.  -RTC with follow-up with Dr. Donella Stade as indicated. -Consider referral back to survivorship as a long-term survivor for continued surveillance  I provided 15 minutes of non face-to-face telephone visit time during this encounter, and > 50% was spent counseling as documented under my assessment & plan.  Rulon Abide, NP AGNP-C [facility]  Note: [place]aryne [place], [place] [phone]

## 2019-05-22 ENCOUNTER — Encounter: Payer: Self-pay | Admitting: Oncology

## 2019-07-09 ENCOUNTER — Other Ambulatory Visit: Payer: Medicare HMO

## 2019-07-16 ENCOUNTER — Encounter: Payer: Self-pay | Admitting: Hematology and Oncology

## 2019-08-13 ENCOUNTER — Other Ambulatory Visit: Payer: Self-pay | Admitting: Medical Oncology

## 2019-08-13 DIAGNOSIS — R109 Unspecified abdominal pain: Secondary | ICD-10-CM

## 2019-08-19 ENCOUNTER — Other Ambulatory Visit: Payer: Self-pay

## 2019-08-19 ENCOUNTER — Ambulatory Visit
Admission: RE | Admit: 2019-08-19 | Discharge: 2019-08-19 | Disposition: A | Discharge status: Home / Self Care | Payer: Medicare HMO | Source: Ambulatory Visit | Attending: Medical Oncology | Admitting: Medical Oncology

## 2019-08-19 DIAGNOSIS — R109 Unspecified abdominal pain: Secondary | ICD-10-CM

## 2019-09-11 ENCOUNTER — Ambulatory Visit: Payer: Medicare HMO | Admitting: Radiation Oncology

## 2019-10-06 NOTE — Progress Notes (Signed)
[facility]  [address], [address] [place], [place] [address] Phone: [phone]  Fax: [phone]   Clinic Day:  10/08/2019  Referring physician: Sharyne Peach, MD  Chief Complaint: Earl Lyons is a 74 y.o. 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   HPI: The patient was last seen in the medical oncology clinic on 04/10/2019 via telemedicine. At that time, he was doing well and denied any concerns. Hematocrit was 42.4, hemoglobin 14.0, platelets 125,000, WBC 6,900. Alkaline phosphatase was 36. Total bilirubin was 1.3. LDH was 149.  The patient saw Faythe Casa, NP on 05/21/2019 to review his survivorship care plan. He had no complaints.  Abdominal ultrasound for abdominal pain, bloating, and constipation x 1 month on 08/19/2019 revealed no acute findings. There was a 5 mm nonobstructing LEFT renal stone and bilateral renal cysts.  The patient had an EGD and colonoscopy on 09/25/2019 with Dr. Eliberto Ivory. Gastric biopsy revealed gastric mucosa with minimal chronic gastritis, negative for H. pylori. Colon polyp biopsy revealed no evidence of high grade dysplasia or carcinoma.  CBC has been followed: 05/13/2019: Hematocrit 43.5, hemoglobin 14.1, platelets 130,000, WBC 6,900. 07/16/2019: Hematocrit 43.2, hemoglobin 14.4, platelets 124,000, WBC 6,900. 09/04/2019: Hematocrit 40.9, hemoglobin 13.4, MCV 90.0, platelets 121,000, WBC 5,800.  During the interim, he has been "fine."  He denies any B symptoms, lumps or bumps.  He denies any bruising or bleeding. He has stable arthritis in his knee, right foot, and left shoulder. The arthritis in his hands is worsening. He takes medication for micro seizures and has not had any seizures recently. He is scheduled for a vision exam and notes that he needs bifocals. He has baseline asymptomatic bradycardia, which causes him no problems. He stays active by riding his bike  with his daughter.  He received the [place] COVID-19 vaccine on 05/25/2019 and 06/16/2019. He had no side effects.   Past Medical History:  Diagnosis Date  . Anginal pain ([place])    In the past--not currently  . Arthritis    Osteoarthritis  . Asthma    IN THE PAST (NO INHALERS IN THE PAST FIVE YEARS)  . Cancer ([place])    Melanomas Skin Cancer  . Coronary artery disease   . Hypertension   . Vertigo     Past Surgical History:  Procedure Laterality Date  . CARDIAC CATHETERIZATION    . EXCISION MASS LOWER EXTREMETIES Right 11/04/2016   Procedure: EXCISION OF MASS  RIGHT THIGH;  Surgeon: Leonie Green, MD;  Location: [facility] ORS;  Service: General;  Laterality: Right;  . HERNIA REPAIR Right    Inguinal Hernia Repair  . KNEE ARTHROSCOPY Right   . SHOULDER ARTHROSCOPY W/ ROTATOR CUFF REPAIR Right   . TONSILLECTOMY    . VASECTOMY      Family History  Problem Relation Age of Onset  . Skin cancer Mother   . Emphysema Mother   . Colon cancer Maternal Uncle     Social History:  reports that he has never smoked. He has never used smokeless tobacco. He reports that he does not drink alcohol and does not use drugs. He states that he underwent a radiation treatment to treat acne as a child. He use to run in school; more recently he has been biking for exercise. 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 He has lived in [place] since 2013. He is married. He has 3 daughters.  The patient is alone today.   Allergies:  Allergies  Allergen Reactions  . Metoprolol Other (See Comments)    bradycardia    Current Medications: Current Outpatient Medications  Medication Sig Dispense Refill  . aspirin EC 81 MG tablet Take 81 mg by mouth every evening.     Marland Kitchen atorvastatin (LIPITOR) 40 MG tablet Take 40 mg by mouth every evening.    . cholecalciferol (VITAMIN D) 1000 units tablet Take 1,000 Units by mouth daily.    . fluocinonide cream (LIDEX) 0.05 % Apply topically bid  prn    . fluticasone (FLONASE) 50 MCG/ACT nasal spray Place into the nose.    . hydrocortisone 2.5 % cream Apply topically 2 (two) times daily.    Marland Kitchen lamoTRIgine (LAMICTAL) 150 MG tablet Take by mouth.    . lamoTRIgine (LAMICTAL) 25 MG tablet Take 100 mg by mouth daily.     Marland Kitchen lisinopril (ZESTRIL) 20 MG tablet Take by mouth.    . psyllium (METAMUCIL) 58.6 % packet Take by mouth daily. One teaspoon a day    . sildenafil (VIAGRA) 50 MG tablet Take by mouth.    . triamcinolone ointment (KENALOG) 0.5 % Apply 1 application topically 2 (two) times daily as needed (itching). Applies to inside of ears when itching     . amLODipine (NORVASC) 5 MG tablet Take 5 mg by mouth every evening. (Patient not taking: Reported on 10/08/2019)    . nitroGLYCERIN (NITROSTAT) 0.4 MG SL tablet Place under the tongue. (Patient not taking: Reported on 10/08/2019)     No current facility-administered medications for this visit.    Review of Systems  Constitutional: Negative.  Negative for chills, diaphoresis, fever, malaise/fatigue and weight loss.       Feels "fine".  HENT: Negative.  Negative for congestion, ear pain, hearing loss, nosebleeds, sinus pain and sore throat.   Eyes: Negative.  Negative for blurred vision, double vision, photophobia and pain.       Due for vision exam.  Respiratory: Negative.  Negative for cough, hemoptysis, shortness of breath and wheezing.   Cardiovascular: Negative.  Negative for chest pain, palpitations, orthopnea and leg swelling.       Baseline asymptomatic bradycardia.  Gastrointestinal: Negative.  Negative for abdominal pain, blood in stool, constipation, diarrhea, heartburn, melena, nausea and vomiting.  Genitourinary: Negative.  Negative for dysuria, frequency, hematuria and urgency.  Musculoskeletal: Positive for joint pain (arthritis in hands, knees, R foot, L shoulder). Negative for back pain, myalgias and neck pain.  Skin: Negative.  Negative for itching and rash.   Neurological: Positive for weakness (due to microseizure medication; stable). Negative for dizziness, tingling, tremors, sensory change, speech change, focal weakness and headaches.  Endo/Heme/Allergies: Negative.  Does not bruise/bleed easily.  Psychiatric/Behavioral: Negative.  Negative for depression and memory loss. The patient is not nervous/anxious.   All other systems reviewed and are negative.  Performance status (ECOG): 0-1  Vitals Blood pressure 122/65, pulse (!) 56, temperature (!) 96.4 F (35.8 C), temperature source Tympanic, resp. rate 16, weight 156 lb 4.9 oz (70.9 kg), SpO2 98 %.   Physical Exam Vitals and nursing note reviewed.  Constitutional:      General: He is not in acute distress.    Appearance: He is well-developed. He is not diaphoretic.  HENT:     Head: Normocephalic and atraumatic.     Comments: Gray hair and beard.    Mouth/Throat:     Mouth: Mucous membranes are moist.  Pharynx: Oropharynx is clear.  Eyes:     General: No scleral icterus.    Extraocular Movements: Extraocular movements intact.     Conjunctiva/sclera: Conjunctivae normal.     Pupils: Pupils are equal, round, and reactive to light.     Comments: Glasses.  Cardiovascular:     Rate and Rhythm: Regular rhythm. Bradycardia present.     Pulses: Normal pulses.     Heart sounds: Normal heart sounds. No murmur heard.   Pulmonary:     Effort: Pulmonary effort is normal. No respiratory distress.     Breath sounds: Normal breath sounds. No wheezing or rales.  Chest:     Chest wall: No tenderness.  Abdominal:     General: Bowel sounds are normal. There is no distension.     Palpations: Abdomen is soft. There is no hepatomegaly, splenomegaly or mass.     Tenderness: There is no abdominal tenderness. There is no guarding or rebound.  Musculoskeletal:        General: No swelling or tenderness. Normal range of motion.     Cervical back: Normal range of motion and neck supple.   Lymphadenopathy:     Head:     Right side of head: No preauricular, posterior auricular or occipital adenopathy.     Left side of head: No preauricular, posterior auricular or occipital adenopathy.     Cervical: No cervical adenopathy.     Upper Body:     Right upper body: No supraclavicular or axillary adenopathy.     Left upper body: No supraclavicular or axillary adenopathy.     Lower Body: No right inguinal adenopathy. No left inguinal adenopathy.  Skin:    General: Skin is warm and dry.  Neurological:     Mental Status: He is alert and oriented to person, place, and time.  Psychiatric:        Mood and Affect: Mood normal.        Behavior: Behavior normal.        Thought Content: Thought content normal.        Judgment: Judgment normal.    Appointment on 10/08/2019  Component Date Value Ref Range Status  . LDH 10/08/2019 144  98 - 192 U/L Final   Performed at [facility], [address] [place], [place] [address]  . Sodium 10/08/2019 135  135 - 145 mmol/L Final  . Potassium 10/08/2019 4.4  3.5 - 5.1 mmol/L Final  . Chloride 10/08/2019 100  98 - 111 mmol/L Final  . CO2 10/08/2019 28  22 - 32 mmol/L Final  . Glucose, Bld 10/08/2019 100* 70 - 99 mg/dL Final   Glucose reference range applies only to samples taken after fasting for at least 8 hours.  . BUN 10/08/2019 20  8 - 23 mg/dL Final  . Creatinine, Ser 10/08/2019 1.07  0.61 - 1.24 mg/dL Final  . Calcium 10/08/2019 8.8* 8.9 - 10.3 mg/dL Final  . Total Protein 10/08/2019 6.7  6.5 - 8.1 g/dL Final  . Albumin 10/08/2019 3.9  3.5 - 5.0 g/dL Final  . AST 10/08/2019 26  15 - 41 U/L Final  . ALT 10/08/2019 19  0 - 44 U/L Final  . Alkaline Phosphatase 10/08/2019 33* 38 - 126 U/L Final  . Total Bilirubin 10/08/2019 1.3* 0.3 - 1.2 mg/dL Final  . GFR calc non Af Amer 10/08/2019 >60  >60 mL/min Final  . GFR calc Af Amer 10/08/2019 >60  >60 mL/min Final  . Anion gap  10/08/2019 7  5 - 15 Final   Performed at [facility], [address], [place], [place] [address]  . WBC 10/08/2019 6.7  4.0 - 10.5 K/uL Final  . RBC 10/08/2019 4.50  4.22 - 5.81 MIL/uL Final  . Hemoglobin 10/08/2019 13.4  13.0 - 17.0 g/dL Final  . HCT 10/08/2019 39.2  39 - 52 % Final  . MCV 10/08/2019 87.1  80.0 - 100.0 fL Final  . MCH 10/08/2019 29.8  26.0 - 34.0 pg Final  . MCHC 10/08/2019 34.2  30.0 - 36.0 g/dL Final  . RDW 10/08/2019 13.2  11.5 - 15.5 % Final  . Platelets 10/08/2019 127* 150 - 400 K/uL Final  . nRBC 10/08/2019 0.0  0.0 - 0.2 % Final  . Neutrophils Relative % 10/08/2019 62  % Final  . Neutro Abs 10/08/2019 4.2  1.7 - 7.7 K/uL Final  . Lymphocytes Relative 10/08/2019 25  % Final  . Lymphs Abs 10/08/2019 1.7  0.7 - 4.0 K/uL Final  . Monocytes Relative 10/08/2019 10  % Final  . Monocytes Absolute 10/08/2019 0.7  0 - 1 K/uL Final  . Eosinophils Relative 10/08/2019 2  % Final  . Eosinophils Absolute 10/08/2019 0.1  0 - 0 K/uL Final  . Basophils Relative 10/08/2019 1  % Final  . Basophils Absolute 10/08/2019 0.0  0 - 0 K/uL Final  . Immature Granulocytes 10/08/2019 0  % Final  . Abs Immature Granulocytes 10/08/2019 0.02  0.00 - 0.07 K/uL Final   Performed at [facility], [address], [place], [place] [address]    Assessment:  CARMINE CARROZZA is a 74 y.o. male with a clinical T1a cutaneous marginal zone lymphomaof the forehead s/p shave biopsy on08/20/2020. 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  Work-up on 12/28/2018 revealed a hematocrit 44.6, hemoglobin 15.0, MCV 88.3, platelets 133,000, and WBC 7200 with an ANC 4,800. LDH was 146. Bilirubin was 1.6.  SPEP, hepatitis B core antibody total, hepatitis B surface antigen, and hepatitis C  antibody were negative.  PET scan on 01/03/2019 revealed no radiographic evidence of malignancy.   He received electron beam therapy of 3000 cGy from 01/23/2019 - 02/12/2019.  The patient received the [place] COVID-19 vaccine on 05/25/2019 and 06/16/2019.  Symptomatically, he feels "fine".  He denies any B symptoms.  He denies any skin changes.  He denies any adenopathy or early satiety.  Exam is unremarkable.  Plan: 1.   Labs today: CBC with diff, CMP, LDH 2.   Clinical stage T1aNxMx cutaneous marginal zone lymphoma Patient presented with disease confined to his forehead.             PET scan revealed no evidence of lymphoma.             He has received electron beam therapy (completed 02/12/2019).  He is asymptomatic.  Exam is unremarkable.  He has no evidence of disease.  Continue to monitor. 3.   Mild thrombocytopenia             Platelet count is 127,000.             He has had a mild thrombocytopenia dating back to 10/25/2016.             Platelet count has ranged between 102,000 - 133,000.  Work-up was negative.  Continue to monitor. 4.   Mildly elevated bilirubin             Bilirubin was 1.3 on 10/08/2019.  Bilirubin was 1.6 on 12/28/2018 and 01/07/2019.             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 5.   RTC in 6 months for MD assessment and labs (CBC with diff, CMP, LDH).  I discussed the assessment and treatment plan with the patient.  The patient was provided an opportunity to ask questions and all were answered.  The patient agreed with the plan and demonstrated an understanding of the instructions.  The patient was advised to call back if the symptoms worsen or if the condition fails to improve as anticipated.   Lequita Asal, MD, PhD    10/08/2019, 12:38 PM  I, Evert Kohl, am acting as a Education administrator for Lequita Asal, MD.  I, [place] Mike Gip, MD, have reviewed the above documentation for accuracy and completeness,  and I agree with the above.

## 2019-10-08 ENCOUNTER — Inpatient Hospital Stay: Payer: Medicare HMO

## 2019-10-08 ENCOUNTER — Encounter: Payer: Self-pay | Admitting: Hematology and Oncology

## 2019-10-08 ENCOUNTER — Inpatient Hospital Stay: Payer: Medicare HMO | Attending: Hematology and Oncology | Admitting: Hematology and Oncology

## 2019-10-08 ENCOUNTER — Other Ambulatory Visit: Payer: Self-pay

## 2019-10-08 VITALS — BP 122/65 | HR 56 | Temp 96.4°F | Resp 16 | Wt 156.3 lb

## 2019-10-08 DIAGNOSIS — J45909 Unspecified asthma, uncomplicated: Secondary | ICD-10-CM | POA: Insufficient documentation

## 2019-10-08 DIAGNOSIS — R17 Unspecified jaundice: Secondary | ICD-10-CM

## 2019-10-08 DIAGNOSIS — L570 Actinic keratosis: Secondary | ICD-10-CM | POA: Diagnosis not present

## 2019-10-08 DIAGNOSIS — C884 Extranodal marginal zone B-cell lymphoma of mucosa-associated lymphoid tissue [MALT-lymphoma]: Secondary | ICD-10-CM

## 2019-10-08 DIAGNOSIS — Z7982 Long term (current) use of aspirin: Secondary | ICD-10-CM | POA: Diagnosis not present

## 2019-10-08 DIAGNOSIS — D696 Thrombocytopenia, unspecified: Secondary | ICD-10-CM

## 2019-10-08 DIAGNOSIS — Z79899 Other long term (current) drug therapy: Secondary | ICD-10-CM | POA: Diagnosis not present

## 2019-10-08 DIAGNOSIS — Z8572 Personal history of non-Hodgkin lymphomas: Secondary | ICD-10-CM | POA: Diagnosis not present

## 2019-10-08 DIAGNOSIS — I2510 Atherosclerotic heart disease of native coronary artery without angina pectoris: Secondary | ICD-10-CM | POA: Insufficient documentation

## 2019-10-08 DIAGNOSIS — M1990 Unspecified osteoarthritis, unspecified site: Secondary | ICD-10-CM | POA: Diagnosis not present

## 2019-10-08 DIAGNOSIS — I10 Essential (primary) hypertension: Secondary | ICD-10-CM | POA: Diagnosis not present

## 2019-10-08 LAB — CBC WITH DIFFERENTIAL/PLATELET
Abs Immature Granulocytes: 0.02 10*3/uL (ref 0.00–0.07)
Basophils Absolute: 0 10*3/uL (ref 0.0–0.1)
Basophils Relative: 1 %
Eosinophils Absolute: 0.1 10*3/uL (ref 0.0–0.5)
Eosinophils Relative: 2 %
HCT: 39.2 % (ref 39.0–52.0)
Hemoglobin: 13.4 g/dL (ref 13.0–17.0)
Immature Granulocytes: 0 %
Lymphocytes Relative: 25 %
Lymphs Abs: 1.7 10*3/uL (ref 0.7–4.0)
MCH: 29.8 pg (ref 26.0–34.0)
MCHC: 34.2 g/dL (ref 30.0–36.0)
MCV: 87.1 fL (ref 80.0–100.0)
Monocytes Absolute: 0.7 10*3/uL (ref 0.1–1.0)
Monocytes Relative: 10 %
Neutro Abs: 4.2 10*3/uL (ref 1.7–7.7)
Neutrophils Relative %: 62 %
Platelets: 127 10*3/uL — ABNORMAL LOW (ref 150–400)
RBC: 4.5 MIL/uL (ref 4.22–5.81)
RDW: 13.2 % (ref 11.5–15.5)
WBC: 6.7 10*3/uL (ref 4.0–10.5)
nRBC: 0 % (ref 0.0–0.2)

## 2019-10-08 LAB — COMPREHENSIVE METABOLIC PANEL
ALT: 19 U/L (ref 0–44)
AST: 26 U/L (ref 15–41)
Albumin: 3.9 g/dL (ref 3.5–5.0)
Alkaline Phosphatase: 33 U/L — ABNORMAL LOW (ref 38–126)
Anion gap: 7 (ref 5–15)
BUN: 20 mg/dL (ref 8–23)
CO2: 28 mmol/L (ref 22–32)
Calcium: 8.8 mg/dL — ABNORMAL LOW (ref 8.9–10.3)
Chloride: 100 mmol/L (ref 98–111)
Creatinine, Ser: 1.07 mg/dL (ref 0.61–1.24)
GFR calc Af Amer: >60 mL/min (ref >60)
GFR calc non Af Amer: >60 mL/min (ref >60)
Glucose, Bld: 100 mg/dL — ABNORMAL HIGH (ref 70–99)
Potassium: 4.4 mmol/L (ref 3.5–5.1)
Sodium: 135 mmol/L (ref 135–145)
Total Bilirubin: 1.3 mg/dL — ABNORMAL HIGH (ref 0.3–1.2)
Total Protein: 6.7 g/dL (ref 6.5–8.1)

## 2019-10-08 LAB — LACTATE DEHYDROGENASE: LDH: 144 U/L (ref 98–192)

## 2019-10-08 NOTE — Progress Notes (Signed)
No new changes noted today 

## 2020-02-19 ENCOUNTER — Other Ambulatory Visit: Payer: Self-pay | Admitting: Physician Assistant

## 2020-02-19 DIAGNOSIS — J339 Nasal polyp, unspecified: Secondary | ICD-10-CM

## 2020-03-03 ENCOUNTER — Ambulatory Visit
Admission: RE | Admit: 2020-03-03 | Discharge: 2020-03-03 | Disposition: A | Discharge status: Home / Self Care | Payer: Medicare HMO | Source: Ambulatory Visit | Attending: Physician Assistant | Admitting: Physician Assistant

## 2020-03-03 ENCOUNTER — Other Ambulatory Visit: Payer: Self-pay

## 2020-03-03 DIAGNOSIS — J339 Nasal polyp, unspecified: Secondary | ICD-10-CM | POA: Diagnosis not present

## 2020-03-03 LAB — POCT I-STAT CREATININE: Creatinine, Ser: 1 mg/dL (ref 0.61–1.24)

## 2020-03-03 MED ORDER — IOHEXOL 300 MG/ML  SOLN
75.0000 mL | Freq: Once | INTRAMUSCULAR | Status: AC | PRN
  Administered 2020-03-03: 75 mL via INTRAVENOUS

## 2020-04-08 NOTE — Progress Notes (Signed)
[facility]  [address], [address] [place], [place] [address] Phone: [phone]  Fax: [phone]   Clinic Day:  04/09/2020  Referring physician: Sharyne Peach, MD  Chief Complaint: Earl Lyons is a 75 y.o. 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   HPI: The patient was last seen in the medical oncology clinic on 10/08/2019. At that time, he felt "fine". He denied any B symptoms.  He denied any skin changes. He denied any adenopathy or early satiety. Exam was unremarkable. Hematocrit was 39.2, hemoglobin 13.4, platelets 127,000, WBC 6,700. LDH was 144.  Maxillofacial CT with contrast on 03/03/2020 revealed a polypoid enhancing mass in the left upper nasal cavity crossing through the nasal septum into the right upper nasal cavity and frontal sinus recess. There was no change in size from the prior MRI of 09/12/2017. This may represent nasal polyp versus neoplasm. Tissue sampling was recommended. There was complete opacification of the maxillary sinus bilaterally. Prior uncinate ectomy bilaterally.  The patient saw Dr. Manuella Ghazi on 03/05/2020 for transient alteration of awareness without LOC- concerns for non convulsive seizures.  Balance and vision are affected.  Spells occurred 30-50 times a day and lasted 2-3 seconds.  He was placed on a trial of lamotrigone to see if frequency of auras and if coordination improves.  Labs on 03/06/2020 at [facility] revealed a hematocrit of 42.5, hemoglobin 14.3, MCV 89, platelets 147,000, WBC 6800.  During the interim, he has been "ok." He denies fevers and chills. He reports early satiety and reflux. He takes Miralax and metamucil daily. He had his vision cheked. His joint pain is stable.   He has an appointment with dermatology for a spot on his left jaw line that has been there for about a year. It is growing and has bled before.   Past Medical History:  Diagnosis Date   . Anginal pain ([place])    In the past--not currently  . Arthritis    Osteoarthritis  . Asthma    IN THE PAST (NO INHALERS IN THE PAST FIVE YEARS)  . Cancer ([place])    Melanomas Skin Cancer  . Coronary artery disease   . Hypertension   . Vertigo     Past Surgical History:  Procedure Laterality Date  . CARDIAC CATHETERIZATION    . EXCISION MASS LOWER EXTREMETIES Right 11/04/2016   Procedure: EXCISION OF MASS  RIGHT THIGH;  Surgeon: Leonie Green, MD;  Location: [facility] ORS;  Service: General;  Laterality: Right;  . HERNIA REPAIR Right    Inguinal Hernia Repair  . KNEE ARTHROSCOPY Right   . SHOULDER ARTHROSCOPY W/ ROTATOR CUFF REPAIR Right   . TONSILLECTOMY    . VASECTOMY      Family History  Problem Relation Age of Onset  . Skin cancer Mother   . Emphysema Mother   . Colon cancer Maternal Uncle     Social History:  reports that he has never smoked. He has never used smokeless tobacco. He reports that he does not drink alcohol and does not use drugs. He states that he underwent a radiation treatment to treat acne as a child. He use to run in school; more recently he has been biking for exercise. 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 He has lived in [place] since 2013. He is married. He has 3 daughters. The patient is alone today.   Allergies:  Allergies  Allergen Reactions  . Metoprolol Other (See Comments)    bradycardia    Current Medications: Current Outpatient Medications  Medication Sig Dispense Refill  . aspirin EC 81 MG tablet Take 81 mg by mouth every evening.     Marland Kitchen atorvastatin (LIPITOR) 40 MG tablet Take 40 mg by mouth every evening.    . cholecalciferol (VITAMIN D) 1000 units tablet Take 1,000 Units by mouth daily.    . fluocinonide cream (LIDEX) 0.05 % Apply topically bid prn    . fluticasone (FLONASE) 50 MCG/ACT nasal spray Place into the nose.    . hydrocortisone 2.5 % cream Apply topically 2 (two) times daily.    Marland Kitchen  lamoTRIgine (LAMICTAL) 150 MG tablet Take by mouth.    . lamoTRIgine (LAMICTAL) 25 MG tablet Take 100 mg by mouth daily.     Marland Kitchen lisinopril (ZESTRIL) 20 MG tablet Take by mouth.    . nitroGLYCERIN (NITROSTAT) 0.4 MG SL tablet Place under the tongue.    . psyllium (METAMUCIL) 58.6 % packet Take by mouth daily. One teaspoon a day    . sildenafil (VIAGRA) 50 MG tablet Take by mouth.    . triamcinolone ointment (KENALOG) 0.5 % Apply 1 application topically 2 (two) times daily as needed (itching). Applies to inside of ears when itching    . amLODipine (NORVASC) 5 MG tablet Take 5 mg by mouth every evening. (Patient not taking: No sig reported)     No current facility-administered medications for this visit.    Review of Systems  Constitutional: Negative.  Negative for chills, diaphoresis, fever, malaise/fatigue and weight loss (up 5 lbs).       Feels "okay."  HENT: Positive for hearing loss (hearing aid). Negative for congestion, ear discharge, ear pain, nosebleeds, sinus pain, sore throat and tinnitus.   Eyes: Negative.  Negative for blurred vision and double vision.  Respiratory: Negative.  Negative for cough, hemoptysis, shortness of breath and wheezing.   Cardiovascular: Negative.  Negative for chest pain, palpitations, orthopnea and leg swelling.       Baseline asymptomatic bradycardia.  Gastrointestinal: Positive for heartburn (reflux). Negative for abdominal pain, blood in stool, constipation, diarrhea, melena, nausea and vomiting.       Early satiety.  Genitourinary: Negative.  Negative for dysuria, frequency, hematuria and urgency.  Musculoskeletal: Positive for joint pain (arthritis in hands, knees, R foot, L shoulder). Negative for back pain, myalgias and neck pain.  Skin: Negative for itching and rash.       Spot on left jaw line, seeing dermatology soon  Neurological: Negative for dizziness, tingling, sensory change, weakness and headaches.  Endo/Heme/Allergies: Negative.  Does not  bruise/bleed easily.  Psychiatric/Behavioral: Negative.  Negative for depression and memory loss. The patient is not nervous/anxious.   All other systems reviewed and are negative.  Performance status (ECOG): 1  Vitals Blood pressure 129/63, pulse (!) 44, temperature (!) 96.7 F (35.9 C), temperature source Tympanic, resp. rate 16, weight 161 lb 11.3 oz (73.4 kg), SpO2 98 %.   Physical Exam Vitals and nursing note reviewed.  Constitutional:      General: He is not in acute distress.    Appearance: He is well-developed. He is not diaphoretic.  HENT:     Head: Normocephalic and atraumatic.     Comments: Gray hair and beard.    Mouth/Throat:     Mouth: Mucous membranes are moist.     Pharynx: Oropharynx is clear.  Eyes:     General: No scleral  icterus.    Extraocular Movements: Extraocular movements intact.     Conjunctiva/sclera: Conjunctivae normal.     Pupils: Pupils are equal, round, and reactive to light.     Comments: Glasses.  Cardiovascular:     Rate and Rhythm: Regular rhythm. Bradycardia present.     Pulses: Normal pulses.     Heart sounds: Normal heart sounds. No murmur heard.   Pulmonary:     Effort: Pulmonary effort is normal. No respiratory distress.     Breath sounds: Normal breath sounds. No wheezing or rales.  Chest:     Chest wall: No tenderness.  Breasts:     Right: No axillary adenopathy or supraclavicular adenopathy.     Left: No axillary adenopathy or supraclavicular adenopathy.    Abdominal:     General: Bowel sounds are normal. There is no distension.     Palpations: Abdomen is soft. There is no hepatomegaly, splenomegaly or mass.     Tenderness: There is no abdominal tenderness. There is no guarding or rebound.  Musculoskeletal:        General: No swelling or tenderness. Normal range of motion.     Cervical back: Normal range of motion and neck supple.  Lymphadenopathy:     Head:     Right side of head: No preauricular, posterior auricular or  occipital adenopathy.     Left side of head: No preauricular, posterior auricular or occipital adenopathy.     Cervical: No cervical adenopathy.     Upper Body:     Right upper body: No supraclavicular or axillary adenopathy.     Left upper body: No supraclavicular or axillary adenopathy.     Lower Body: No right inguinal adenopathy. No left inguinal adenopathy.  Skin:    General: Skin is warm and dry.     Comments: 8 x 4 mm flesh-toned nodular lesion on left jaw line. Not fixed.  Neurological:     Mental Status: He is alert and oriented to person, place, and time.  Psychiatric:        Mood and Affect: Mood normal.        Behavior: Behavior normal.        Thought Content: Thought content normal.        Judgment: Judgment normal.    Appointment on 04/09/2020  Component Date Value Ref Range Status  . LDH 04/09/2020 147  98 - 192 U/L Final   Performed at [facility], [address] [place], [place] [address]  . WBC 04/09/2020 7.1  4.0 - 10.5 K/uL Final  . RBC 04/09/2020 4.93  4.22 - 5.81 MIL/uL Final  . Hemoglobin 04/09/2020 14.4  13.0 - 17.0 g/dL Final  . HCT 26/94/8546 42.8  39.0 - 52.0 % Final  . MCV 04/09/2020 86.8  80.0 - 100.0 fL Final  . MCH 04/09/2020 29.2  26.0 - 34.0 pg Final  . MCHC 04/09/2020 33.6  30.0 - 36.0 g/dL Final  . RDW 27/06/5007 13.2  11.5 - 15.5 % Final  . Platelets 04/09/2020 164  150 - 400 K/uL Final  . nRBC 04/09/2020 0.0  0.0 - 0.2 % Final  . Neutrophils Relative % 04/09/2020 56  % Final  . Neutro Abs 04/09/2020 4.0  1.7 - 7.7 K/uL Final  . Lymphocytes Relative 04/09/2020 28  % Final  . Lymphs Abs 04/09/2020 1.9  0.7 - 4.0 K/uL Final  . Monocytes Relative 04/09/2020 13  % Final  . Monocytes Absolute 04/09/2020 0.9  0.1 -  1.0 K/uL Final  . Eosinophils Relative 04/09/2020 2  % Final  . Eosinophils Absolute 04/09/2020 0.1  0.0 - 0.5 K/uL Final  . Basophils Relative 04/09/2020 1  % Final  . Basophils Absolute 04/09/2020 0.0  0.0 - 0.1 K/uL  Final  . Immature Granulocytes 04/09/2020 0  % Final  . Abs Immature Granulocytes 04/09/2020 0.01  0.00 - 0.07 K/uL Final   Performed at [facility], [address], [place], [place] [address]  . Sodium 04/09/2020 137  135 - 145 mmol/L Final  . Potassium 04/09/2020 4.4  3.5 - 5.1 mmol/L Final  . Chloride 04/09/2020 103  98 - 111 mmol/L Final  . CO2 04/09/2020 24  22 - 32 mmol/L Final  . Glucose, Bld 04/09/2020 91  70 - 99 mg/dL Final   Glucose reference range applies only to samples taken after fasting for at least 8 hours.  . BUN 04/09/2020 16  8 - 23 mg/dL Final  . Creatinine, Ser 04/09/2020 1.09  0.61 - 1.24 mg/dL Final  . Calcium 04/09/2020 9.0  8.9 - 10.3 mg/dL Final  . Total Protein 04/09/2020 7.1  6.5 - 8.1 g/dL Final  . Albumin 04/09/2020 4.2  3.5 - 5.0 g/dL Final  . AST 04/09/2020 30  15 - 41 U/L Final  . ALT 04/09/2020 22  0 - 44 U/L Final  . Alkaline Phosphatase 04/09/2020 33* 38 - 126 U/L Final  . Total Bilirubin 04/09/2020 1.8* 0.3 - 1.2 mg/dL Final  . GFR, Estimated 04/09/2020 >60  >60 mL/min Final   Comment: (NOTE) Calculated using the CKD-EPI Creatinine Equation (2021)   . Anion gap 04/09/2020 10  5 - 15 Final   Performed at [facility], [address] [place], [place] [address]    Assessment:  Earl Lyons is a 75 y.o. male with a clinical T1a cutaneous marginal zone lymphomaof the forehead s/p shave biopsy on08/20/2020. 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  Work-up on 12/28/2018 revealed a hematocrit 44.6, hemoglobin 15.0, MCV 88.3, platelets 133,000, and WBC 7200 with an ANC 4,800. LDH was 146. Bilirubin was 1.6.  SPEP, hepatitis B core antibody total, hepatitis B surface antigen,  and hepatitis C antibody were negative.  PET scan on 01/03/2019 revealed no radiographic evidence of malignancy.   He received electron beam therapy of 3000 cGy from 01/23/2019 - 02/12/2019.  Maxillofacial CT with contrast on 03/03/2020 revealed a polypoid enhancing mass in the left upper nasal cavity crossing through the nasal septum into the right upper nasal cavity and frontal sinus recess. There was no change in size from the prior MRI of 09/12/2017. This may represent nasal polyp versus neoplasm. Tissue sampling was recommended. There was complete opacification of the maxillary sinus bilaterally. Prior uncinate ectomy bilaterally.  The patient received the [place] COVID-19 vaccine on 05/25/2019 and 06/16/2019.  Symptomatically, he has been "ok." He denies any B symptoms. He reports early satiety and reflux. He has an 8 mm flesh-toned nodular lesion on left jaw line that is growing and has bled.  Exam reveals no adenopathy or hepatosplenomgealy.  Plan: 1.   Labs today: CBC with diff, CMP, LDH. 2.   Clinical stage T1aNxMx cutaneous marginal zone lymphoma He presented with disease confined to his forehead.  PET scan revealed no evidence of lymphoma.             He has received electron beam therapy (completed 02/12/2019).  He denies any B symptoms.  Exam reveals no adenopathy or hepatosplenomegaly.  Continue to monitor. 3.   Mild thrombocytopenia, resolved             Platelet count is 164,000.             He has had a mild thrombocytopenia dating back to 10/25/2016.             Platelet count has ranged between 102,000 - 133,000.             Work-up was negative.  Continue to monitor. 4.   Mildly elevated bilirubin             Bilirubin is 1.8 (direct 0.2) today.             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 5.   Nodular skin lesion  Discuss plans to follow-up with dermatology for excision. 6.   Nasal polyp  Maxillofacial CT on 03/03/2020  revealed a polypoid enhancing mass in the left upper nasal cavity.   Mass crosses through the nasal septum into the right upper nasal cavity and frontal sinus recess.    Lesion appears stable from 09/12/2017.    Tissue sampling was recommended.    Notes indicate referral to [facility]. 7.   RTC in 6 months for MD assessment and labs (CBC with diff, CMP, LDH).  I discussed the assessment and treatment plan with the patient.  The patient was provided an opportunity to ask questions and all were answered.  The patient agreed with the plan and demonstrated an understanding of the instructions.  The patient was advised to call back if the symptoms worsen or if the condition fails to improve as anticipated.   Lequita Asal, MD, PhD    04/09/2020, 10:57 AM  I, Mirian Mo Tufford, am acting as a Education administrator for Lequita Asal, MD.  I, [place] Mike Gip, MD, have reviewed the above documentation for accuracy and completeness, and I agree with the above.

## 2020-04-09 ENCOUNTER — Other Ambulatory Visit: Payer: Self-pay

## 2020-04-09 ENCOUNTER — Encounter: Payer: Self-pay | Admitting: Hematology and Oncology

## 2020-04-09 ENCOUNTER — Inpatient Hospital Stay: Payer: Medicare HMO

## 2020-04-09 ENCOUNTER — Inpatient Hospital Stay: Payer: Medicare HMO | Attending: Hematology and Oncology | Admitting: Hematology and Oncology

## 2020-04-09 VITALS — BP 129/63 | HR 44 | Temp 96.7°F | Resp 16 | Wt 161.7 lb

## 2020-04-09 DIAGNOSIS — D696 Thrombocytopenia, unspecified: Secondary | ICD-10-CM | POA: Diagnosis not present

## 2020-04-09 DIAGNOSIS — R17 Unspecified jaundice: Secondary | ICD-10-CM | POA: Diagnosis not present

## 2020-04-09 DIAGNOSIS — Z923 Personal history of irradiation: Secondary | ICD-10-CM | POA: Insufficient documentation

## 2020-04-09 DIAGNOSIS — Z79899 Other long term (current) drug therapy: Secondary | ICD-10-CM | POA: Diagnosis not present

## 2020-04-09 DIAGNOSIS — Z8572 Personal history of non-Hodgkin lymphomas: Secondary | ICD-10-CM | POA: Insufficient documentation

## 2020-04-09 DIAGNOSIS — C884 Extranodal marginal zone B-cell lymphoma of mucosa-associated lymphoid tissue [MALT-lymphoma]: Secondary | ICD-10-CM

## 2020-04-09 LAB — COMPREHENSIVE METABOLIC PANEL
ALT: 22 U/L (ref 0–44)
AST: 30 U/L (ref 15–41)
Albumin: 4.2 g/dL (ref 3.5–5.0)
Alkaline Phosphatase: 33 U/L — ABNORMAL LOW (ref 38–126)
Anion gap: 10 (ref 5–15)
BUN: 16 mg/dL (ref 8–23)
CO2: 24 mmol/L (ref 22–32)
Calcium: 9 mg/dL (ref 8.9–10.3)
Chloride: 103 mmol/L (ref 98–111)
Creatinine, Ser: 1.09 mg/dL (ref 0.61–1.24)
GFR, Estimated: >60 mL/min (ref >60)
Glucose, Bld: 91 mg/dL (ref 70–99)
Potassium: 4.4 mmol/L (ref 3.5–5.1)
Sodium: 137 mmol/L (ref 135–145)
Total Bilirubin: 1.8 mg/dL — ABNORMAL HIGH (ref 0.3–1.2)
Total Protein: 7.1 g/dL (ref 6.5–8.1)

## 2020-04-09 LAB — CBC WITH DIFFERENTIAL/PLATELET
Abs Immature Granulocytes: 0.01 10*3/uL (ref 0.00–0.07)
Basophils Absolute: 0 10*3/uL (ref 0.0–0.1)
Basophils Relative: 1 %
Eosinophils Absolute: 0.1 10*3/uL (ref 0.0–0.5)
Eosinophils Relative: 2 %
HCT: 42.8 % (ref 39.0–52.0)
Hemoglobin: 14.4 g/dL (ref 13.0–17.0)
Immature Granulocytes: 0 %
Lymphocytes Relative: 28 %
Lymphs Abs: 1.9 10*3/uL (ref 0.7–4.0)
MCH: 29.2 pg (ref 26.0–34.0)
MCHC: 33.6 g/dL (ref 30.0–36.0)
MCV: 86.8 fL (ref 80.0–100.0)
Monocytes Absolute: 0.9 10*3/uL (ref 0.1–1.0)
Monocytes Relative: 13 %
Neutro Abs: 4 10*3/uL (ref 1.7–7.7)
Neutrophils Relative %: 56 %
Platelets: 164 10*3/uL (ref 150–400)
RBC: 4.93 MIL/uL (ref 4.22–5.81)
RDW: 13.2 % (ref 11.5–15.5)
WBC: 7.1 10*3/uL (ref 4.0–10.5)
nRBC: 0 % (ref 0.0–0.2)

## 2020-04-09 LAB — LACTATE DEHYDROGENASE: LDH: 147 U/L (ref 98–192)

## 2020-04-09 LAB — BILIRUBIN, DIRECT: Bilirubin, Direct: 0.2 mg/dL (ref 0.0–0.2)

## 2020-04-09 NOTE — Progress Notes (Signed)
Patient states he has a spot on the left side of the  jaw line. States the spot has been there for over a year and will be seeing his dermatologist about it.

## 2020-04-13 ENCOUNTER — Telehealth: Payer: Self-pay

## 2020-04-25 DIAGNOSIS — U071 COVID-19: Secondary | ICD-10-CM

## 2020-04-25 HISTORY — DX: COVID-19: U07.1

## 2020-05-04 ENCOUNTER — Telehealth: Payer: Self-pay | Admitting: *Deleted

## 2020-05-04 NOTE — Telephone Encounter (Signed)
Patient called reporting that he left his CT scan at the desk for Dr C to review and is asking if she has looked at it yet and what she thinks and if he needs to just keep his appointment in July Please advise

## 2020-05-04 NOTE — Telephone Encounter (Signed)
  Please call patient.  I would like him seen by ENT.  Can we schedule?  M

## 2020-07-02 ENCOUNTER — Encounter: Payer: Self-pay | Admitting: Otolaryngology

## 2020-07-02 ENCOUNTER — Other Ambulatory Visit: Payer: Self-pay

## 2020-07-06 NOTE — Discharge Instructions (Signed)
[place] [facility]  What is Functional Endoscopic Sinus Surgery?  The Surgery involves making the natural openings of the sinuses larger by removing the bony partitions that separate the sinuses from the nasal cavity.  The natural sinus lining is preserved as much as possible to allow the sinuses to resume normal function after the surgery.  In some patients nasal polyps (excessively swollen lining of the sinuses) may be removed to relieve obstruction of the sinus openings.  The surgery is performed through the nose using lighted scopes, which eliminates the need for incisions on the face.  A septoplasty is a different procedure which is sometimes performed with sinus surgery.  It involves straightening the boy partition that separates the two sides of your nose.  A crooked or deviated septum may need repair if is obstructing the sinuses or nasal airflow.  Turbinate reduction is also often performed during sinus surgery.  The turbinates are bony proturberances from the side walls of the nose which swell and can obstruct the nose in patients with sinus and allergy problems.  Their size can be surgically reduced to help relieve nasal obstruction.  What Can Sinus Surgery Do For Me?  Sinus surgery can reduce the frequency of sinus infections requiring antibiotic treatment.  This can provide improvement in nasal congestion, post-nasal drainage, facial pressure and nasal obstruction.  Surgery will NOT prevent you from ever having an infection again, so it usually only for patients who get infections 4 or more times yearly requiring antibiotics, or for infections that do not clear with antibiotics.  It will not cure nasal allergies, so patients with allergies may still require medication to treat their allergies after surgery. Surgery may improve headaches related to sinusitis, however, some people will continue to  require medication to control sinus headaches related to allergies.  Surgery will do nothing for other forms of headache (migraine, tension or cluster).  What Are the Risks of Endoscopic Sinus Surgery?  Current techniques allow surgery to be performed safely with little risk, however, there are rare complications that patients should be aware of.  Because the sinuses are located around the eyes, there is risk of eye injury, including blindness, though again, this would be quite rare. This is usually a result of bleeding behind the eye during surgery, which puts the vision oat risk, though there are treatments to protect the vision and prevent permanent disrupted by surgery causing a leak of the spinal fluid that surrounds the brain.  More serious complications would include bleeding inside the brain cavity or damage to the brain.  Again, all of these complications are uncommon, and spinal fluid leaks can be safely managed surgically if they occur.  The most common complication of sinus surgery is bleeding from the nose, which may require packing or cauterization of the nose.  Continued sinus have polyps may experience recurrence of the polyps requiring revision surgery.  Alterations of sense of smell or injury to the tear ducts are also rare complications.   What is the Surgery Like, and what is the Recovery?  The Surgery usually takes a couple of hours to perform, and is usually performed under a general anesthetic (completely asleep).  Patients are usually discharged home after a couple of hours.  Sometimes during surgery it is necessary to pack the nose to control bleeding, and the packing is left in place for 24 - 48 hours, and removed by your surgeon.  If a septoplasty was performed during the procedure, there is often a splint placed which must be removed after 5-7 days.   Discomfort: Pain is usually mild to moderate, and can be controlled by prescription pain medication or acetaminophen (Tylenol).   Aspirin, Ibuprofen (Advil, Motrin), or Naprosyn (Aleve) should be avoided, as they can cause increased bleeding.  Most patients feel sinus pressure like they have a bad head cold for several days.  Sleeping with your head elevated can help reduce swelling and facial pressure, as can ice packs over the face.  A humidifier may be helpful to keep the mucous and blood from drying in the nose.   Diet: There are no specific diet restrictions, however, you should generally start with clear liquids and a light diet of bland foods because the anesthetic can cause some nausea.  Advance your diet depending on how your stomach feels.  Taking your pain medication with food will often help reduce stomach upset which pain medications can cause.  Nasal Saline Irrigation: It is important to remove blood clots and dried mucous from the nose as it is healing.  This is done by having you irrigate the nose at least 3 - 4 times daily with a salt water solution.  We recommend using NeilMed Sinus Rinse (available at the drug store).  Fill the squeeze bottle with the solution, bend over a sink, and insert the tip of the squeeze bottle into the nose  of an inch.  Point the tip of the squeeze bottle towards the inside corner of the eye on the same side your irrigating.  Squeeze the bottle and gently irrigate the nose.  If you bend forward as you do this, most of the fluid will flow back out of the nose, instead of down your throat.   The solution should be warm, near body temperature, when you irrigate.   Each time you irrigate, you should use a full squeeze bottle.   Note that if you are instructed to use Nasal Steroid Sprays at any time after your surgery, irrigate with saline BEFORE using the steroid spray, so you do not wash it all out of the nose. Another product, Nasal Saline Gel (such as AYR Nasal Saline Gel) can be applied in each nostril 3 - 4 times daily to moisture the nose and reduce scabbing or crusting.  Bleeding:   Bloody drainage from the nose can be expected for several days, and patients are instructed to irrigate their nose frequently with salt water to help remove mucous and blood clots.  The drainage may be dark red or brown, though some fresh blood may be seen intermittently, especially after irrigation.  Do not blow you nose, as bleeding may occur. If you must sneeze, keep your mouth open to allow air to escape through your mouth.  If heavy bleeding occurs: Irrigate the nose with saline to rinse out clots, then spray the nose 3 - 4 times with Afrin Nasal Decongestant Spray.  The spray will constrict the blood vessels to slow bleeding.  Pinch the lower half of your nose shut to apply pressure, and lay down with your head elevated.  Ice packs over the nose may help as well. If bleeding persists despite these measures, you should notify your doctor.  Do not use the Afrin routinely to control nasal congestion after surgery, as it can result in worsening congestion and may affect healing.     Activity: Return to work varies among patients. Most patients will be   out of work at least 5 - 7 days to recover.  Patient may return to work after they are off of narcotic pain medication, and feeling well enough to perform the functions of their job.  Patients must avoid heavy lifting (over 10 pounds) or strenuous physical for 2 weeks after surgery, so your employer may need to assign you to light duty, or keep you out of work longer if light duty is not possible.  NOTE: you should not drive, operate dangerous machinery, do any mentally demanding tasks or make any important legal or financial decisions while on narcotic pain medication and recovering from the general anesthetic.    Call Your Doctor Immediately if You Have Any of the Following: 1. Bleeding that you cannot control with the above measures 2. Loss of vision, double vision, bulging of the eye or black eyes. 3. Fever over 101 degrees 4. Neck stiffness with  severe headache, fever, nausea and change in mental state. You are always encourage to call anytime with concerns, however, please call with requests for pain medication refills during office hours.  Office Endoscopy: During follow-up visits your doctor will remove any packing or splints that may have been placed and evaluate and clean your sinuses endoscopically.  Topical anesthetic will be used to make this as comfortable as possible, though you may want to take your pain medication prior to the visit.  How often this will need to be done varies from patient to patient.  After complete recovery from the surgery, you may need follow-up endoscopy from time to time, particularly if there is concern of recurrent infection or nasal polyps.  General Anesthesia, Adult, Care After This sheet gives you information about how to care for yourself after your procedure. Your health care provider may also give you more specific instructions. If you have problems or questions, contact your health care provider. What can I expect after the procedure? After the procedure, the following side effects are common:  Pain or discomfort at the IV site.  Nausea.  Vomiting.  Sore throat.  Trouble concentrating.  Feeling cold or chills.  Feeling weak or tired.  Sleepiness and fatigue.  Soreness and body aches. These side effects can affect parts of the body that were not involved in surgery. Follow these instructions at home: For the time period you were told by your health care provider:  Rest.  Do not participate in activities where you could fall or become injured.  Do not drive or use machinery.  Do not drink alcohol.  Do not take sleeping pills or medicines that cause drowsiness.  Do not make important decisions or sign legal documents.  Do not take care of children on your own.   Eating and drinking  Follow any instructions from your health care provider about eating or drinking  restrictions.  When you feel hungry, start by eating small amounts of foods that are soft and easy to digest (bland), such as toast. Gradually return to your regular diet.  Drink enough fluid to keep your urine pale yellow.  If you vomit, rehydrate by drinking water, juice, or clear broth. General instructions  If you have sleep apnea, surgery and certain medicines can increase your risk for breathing problems. Follow instructions from your health care provider about wearing your sleep device: ? Anytime you are sleeping, including during daytime naps. ? While taking prescription pain medicines, sleeping medicines, or medicines that make you drowsy.  Have a responsible adult stay with you for the  time you are told. It is important to have someone help care for you until you are awake and alert.  Return to your normal activities as told by your health care provider. Ask your health care provider what activities are safe for you.  Take over-the-counter and prescription medicines only as told by your health care provider.  If you smoke, do not smoke without supervision.  Keep all follow-up visits as told by your health care provider. This is important. Contact a health care provider if:  You have nausea or vomiting that does not get better with medicine.  You cannot eat or drink without vomiting.  You have pain that does not get better with medicine.  You are unable to pass urine.  You develop a skin rash.  You have a fever.  You have redness around your IV site that gets worse. Get help right away if:  You have difficulty breathing.  You have chest pain.  You have blood in your urine or stool, or you vomit blood. Summary  After the procedure, it is common to have a sore throat or nausea. It is also common to feel tired.  Have a responsible adult stay with you for the time you are told. It is important to have someone help care for you until you are awake and  alert.  When you feel hungry, start by eating small amounts of foods that are soft and easy to digest (bland), such as toast. Gradually return to your regular diet.  Drink enough fluid to keep your urine pale yellow.  Return to your normal activities as told by your health care provider. Ask your health care provider what activities are safe for you. This information is not intended to replace advice given to you by your health care provider. Make sure you discuss any questions you have with your health care provider. Document Revised: 12/05/2019 Document Reviewed: 07/04/2019 Elsevier Patient Education  2021 [employer].

## 2020-07-07 ENCOUNTER — Other Ambulatory Visit
Admission: RE | Admit: 2020-07-07 | Discharge: 2020-07-07 | Disposition: A | Discharge status: Home / Self Care | Payer: Medicare HMO | Source: Ambulatory Visit | Attending: Otolaryngology | Admitting: Otolaryngology

## 2020-07-07 ENCOUNTER — Other Ambulatory Visit: Payer: Self-pay

## 2020-07-07 DIAGNOSIS — Z20822 Contact with and (suspected) exposure to covid-19: Secondary | ICD-10-CM | POA: Insufficient documentation

## 2020-07-07 DIAGNOSIS — Z01812 Encounter for preprocedural laboratory examination: Secondary | ICD-10-CM | POA: Insufficient documentation

## 2020-07-07 LAB — SARS CORONAVIRUS 2 (TAT 6-24 HRS): SARS Coronavirus 2: NEGATIVE

## 2020-07-09 ENCOUNTER — Other Ambulatory Visit: Payer: Self-pay

## 2020-07-09 ENCOUNTER — Ambulatory Visit
Admission: RE | Admit: 2020-07-09 | Discharge: 2020-07-09 | Disposition: A | Discharge status: Home / Self Care | Payer: Medicare HMO | Attending: Otolaryngology | Admitting: Otolaryngology

## 2020-07-09 ENCOUNTER — Ambulatory Visit: Payer: Medicare HMO | Admitting: Anesthesiology

## 2020-07-09 ENCOUNTER — Encounter
Admission: RE | Disposition: A | Discharge status: Home / Self Care | Payer: Self-pay | Source: Home / Self Care | Attending: Otolaryngology

## 2020-07-09 ENCOUNTER — Encounter: Payer: Self-pay | Admitting: Otolaryngology

## 2020-07-09 DIAGNOSIS — Z8572 Personal history of non-Hodgkin lymphomas: Secondary | ICD-10-CM | POA: Diagnosis not present

## 2020-07-09 DIAGNOSIS — Z7982 Long term (current) use of aspirin: Secondary | ICD-10-CM | POA: Insufficient documentation

## 2020-07-09 DIAGNOSIS — J320 Chronic maxillary sinusitis: Secondary | ICD-10-CM | POA: Diagnosis not present

## 2020-07-09 DIAGNOSIS — J343 Hypertrophy of nasal turbinates: Secondary | ICD-10-CM | POA: Insufficient documentation

## 2020-07-09 DIAGNOSIS — D1039 Benign neoplasm of other parts of mouth: Secondary | ICD-10-CM | POA: Diagnosis not present

## 2020-07-09 DIAGNOSIS — B977 Papillomavirus as the cause of diseases classified elsewhere: Secondary | ICD-10-CM | POA: Insufficient documentation

## 2020-07-09 DIAGNOSIS — J338 Other polyp of sinus: Secondary | ICD-10-CM | POA: Diagnosis not present

## 2020-07-09 DIAGNOSIS — J3489 Other specified disorders of nose and nasal sinuses: Secondary | ICD-10-CM | POA: Diagnosis not present

## 2020-07-09 DIAGNOSIS — Z79899 Other long term (current) drug therapy: Secondary | ICD-10-CM | POA: Insufficient documentation

## 2020-07-09 DIAGNOSIS — J342 Deviated nasal septum: Secondary | ICD-10-CM | POA: Diagnosis not present

## 2020-07-09 HISTORY — PX: MAXILLARY ANTROSTOMY: SHX2003

## 2020-07-09 HISTORY — PX: MASS EXCISION: SHX2000

## 2020-07-09 HISTORY — PX: IMAGE GUIDED SINUS SURGERY: SHX6570

## 2020-07-09 HISTORY — DX: Presence of external hearing-aid: Z97.4

## 2020-07-09 HISTORY — DX: Gastro-esophageal reflux disease without esophagitis: K21.9

## 2020-07-09 HISTORY — PX: ENDOSCOPIC CONCHA BULLOSA RESECTION: SHX6395

## 2020-07-09 HISTORY — PX: NASAL SEPTOPLASTY W/ TURBINOPLASTY: SHX2070

## 2020-07-09 SURGERY — SINUS SURGERY, WITH IMAGING GUIDANCE
Anesthesia: General | Site: Nose

## 2020-07-09 MED ORDER — CEPHALEXIN 500 MG PO CAPS: 500.0000 mg | ORAL_CAPSULE | Freq: Two times a day (BID) | ORAL | 0 refills | Status: DC

## 2020-07-09 MED ORDER — DEXAMETHASONE SODIUM PHOSPHATE 4 MG/ML IJ SOLN
INTRAMUSCULAR | Status: DC | PRN
  Administered 2020-07-09: 10 mg via INTRAVENOUS

## 2020-07-09 MED ORDER — FENTANYL CITRATE (PF) 100 MCG/2ML IJ SOLN: 25.0000 ug | INTRAMUSCULAR | Status: DC | PRN

## 2020-07-09 MED ORDER — PHENYLEPHRINE HCL 0.5 % NA SOLN
NASAL | Status: DC | PRN
  Administered 2020-07-09: 15 mL via TOPICAL

## 2020-07-09 MED ORDER — ONDANSETRON HCL 4 MG/2ML IJ SOLN
INTRAMUSCULAR | Status: DC | PRN
  Administered 2020-07-09: 4 mg via INTRAVENOUS

## 2020-07-09 MED ORDER — LACTATED RINGERS IV SOLN: INTRAVENOUS | Status: DC

## 2020-07-09 MED ORDER — GLYCOPYRROLATE 0.2 MG/ML IJ SOLN
INTRAMUSCULAR | Status: DC | PRN
  Administered 2020-07-09: .1 mg via INTRAVENOUS

## 2020-07-09 MED ORDER — LIDOCAINE HCL (CARDIAC) PF 100 MG/5ML IV SOSY
PREFILLED_SYRINGE | INTRAVENOUS | Status: DC | PRN
  Administered 2020-07-09: 50 mg via INTRAVENOUS

## 2020-07-09 MED ORDER — OXYMETAZOLINE HCL 0.05 % NA SOLN
2.0000 | Freq: Once | NASAL | Status: AC
  Administered 2020-07-09: 2 via NASAL

## 2020-07-09 MED ORDER — MIDAZOLAM HCL 5 MG/5ML IJ SOLN
INTRAMUSCULAR | Status: DC | PRN
  Administered 2020-07-09: 2 mg via INTRAVENOUS

## 2020-07-09 MED ORDER — HYDROCODONE-ACETAMINOPHEN 5-325 MG PO TABS: 1.0000 | ORAL_TABLET | Freq: Four times a day (QID) | ORAL | 0 refills | Status: AC | PRN

## 2020-07-09 MED ORDER — EPHEDRINE SULFATE 50 MG/ML IJ SOLN
INTRAMUSCULAR | Status: DC | PRN
  Administered 2020-07-09: 10 mg via INTRAVENOUS
  Administered 2020-07-09: 5 mg via INTRAVENOUS
  Administered 2020-07-09: 10 mg via INTRAVENOUS
  Administered 2020-07-09 (×4): 5 mg via INTRAVENOUS

## 2020-07-09 MED ORDER — SUCCINYLCHOLINE CHLORIDE 20 MG/ML IJ SOLN
INTRAMUSCULAR | Status: DC | PRN
  Administered 2020-07-09: 100 mg via INTRAVENOUS

## 2020-07-09 MED ORDER — ACETAMINOPHEN 10 MG/ML IV SOLN
1000.0000 mg | Freq: Once | INTRAVENOUS | Status: AC
  Administered 2020-07-09: 1000 mg via INTRAVENOUS

## 2020-07-09 MED ORDER — PREDNISONE 10 MG PO TABS: ORAL_TABLET | ORAL | 0 refills | Status: AC

## 2020-07-09 MED ORDER — LIDOCAINE-EPINEPHRINE 1 %-1:100000 IJ SOLN
INTRAMUSCULAR | Status: DC | PRN
  Administered 2020-07-09: 7 mL

## 2020-07-09 MED ORDER — DEXTROSE 5 % IV SOLN
2000.0000 mg | Freq: Once | INTRAVENOUS | Status: AC
  Administered 2020-07-09: 2000 mg via INTRAVENOUS

## 2020-07-09 MED ORDER — FENTANYL CITRATE (PF) 100 MCG/2ML IJ SOLN
INTRAMUSCULAR | Status: DC | PRN
  Administered 2020-07-09: 50 ug via INTRAVENOUS

## 2020-07-09 MED ORDER — OXYCODONE HCL 5 MG/5ML PO SOLN
5.0000 mg | Freq: Once | ORAL | Status: AC | PRN
Start: 2020-07-09 — End: 2020-07-09
  Administered 2020-07-09: 5 mg via ORAL

## 2020-07-09 MED ORDER — OXYCODONE HCL 5 MG PO TABS: 5.0000 mg | ORAL_TABLET | Freq: Once | ORAL | Status: AC | PRN

## 2020-07-09 MED ORDER — PROPOFOL 10 MG/ML IV BOLUS
INTRAVENOUS | Status: DC | PRN
  Administered 2020-07-09: 150 mg via INTRAVENOUS

## 2020-07-09 SURGICAL SUPPLY — 38 items
BATTERY INSTRU NAVIGATION (MISCELLANEOUS) ×15 IMPLANT
BTRY SRG DRVR LF (MISCELLANEOUS) ×12
CANISTER SUCT 1200ML W/VALVE (MISCELLANEOUS) ×5 IMPLANT
CATH IV 18X1 1/4 SAFELET (CATHETERS) ×5 IMPLANT
COAGULATOR SUCT 8FR VV (MISCELLANEOUS) ×5 IMPLANT
DRAPE HEAD BAR (DRAPES) ×5 IMPLANT
ELECT REM PT RETURN 9FT ADLT (ELECTROSURGICAL) ×5
ELECTRODE REM PT RTRN 9FT ADLT (ELECTROSURGICAL) ×4 IMPLANT
GLOVE PI ULTRA LF STRL 7.5 (GLOVE) ×8 IMPLANT
GLOVE PI ULTRA NON LATEX 7.5 (GLOVE) ×2
GOWN STRL REUS W/ TWL LRG LVL3 (GOWN DISPOSABLE) ×4 IMPLANT
GOWN STRL REUS W/TWL LRG LVL3 (GOWN DISPOSABLE) ×5
IV CATH 18X1 1/4 SAFELET (CATHETERS) ×4
IV NS 500ML (IV SOLUTION) ×5
IV NS 500ML BAXH (IV SOLUTION) ×4 IMPLANT
KIT TURNOVER KIT A (KITS) ×5 IMPLANT
NEEDLE ANESTHESIA  27G X 3.5 (NEEDLE) ×1
NEEDLE ANESTHESIA 27G X 3.5 (NEEDLE) ×4 IMPLANT
NEEDLE HYPO 27GX1-1/4 (NEEDLE) ×5 IMPLANT
NS IRRIG 500ML POUR BTL (IV SOLUTION) ×5 IMPLANT
PACK ENT CUSTOM (PACKS) ×5 IMPLANT
PACKING NASAL EPIS 4X2.4 XEROG (MISCELLANEOUS) ×10 IMPLANT
PATTIES SURGICAL .5 X3 (DISPOSABLE) ×5 IMPLANT
SHAVER DIEGO BLD STD TYPE A (BLADE) ×5 IMPLANT
SOL ANTI-FOG 6CC FOG-OUT (MISCELLANEOUS) ×4 IMPLANT
SOL FOG-OUT ANTI-FOG 6CC (MISCELLANEOUS) ×1
SPLINT NASAL SEPTAL BLV .50 ST (MISCELLANEOUS) ×5 IMPLANT
STRAP BODY AND KNEE 60X3 (MISCELLANEOUS) ×10 IMPLANT
SUT CHROMIC 3-0 (SUTURE) ×5
SUT CHROMIC 3-0 KS 27XMFL CR (SUTURE) ×4
SUT ETHILON 3-0 KS 30 BLK (SUTURE) ×5 IMPLANT
SUT PLAIN GUT 4-0 (SUTURE) ×5 IMPLANT
SUTURE CHRMC 3-0 KS 27XMFL CR (SUTURE) ×4 IMPLANT
SYR 3ML LL SCALE MARK (SYRINGE) ×5 IMPLANT
TOWEL OR 17X26 4PK STRL BLUE (TOWEL DISPOSABLE) ×5 IMPLANT
TRACKER CRANIALMASK (MASK) ×5 IMPLANT
TUBING DECLOG MULTIDEBRIDER (TUBING) ×5 IMPLANT
WATER STERILE IRR 250ML POUR (IV SOLUTION) ×5 IMPLANT

## 2020-07-09 NOTE — Anesthesia Postprocedure Evaluation (Signed)
Anesthesia Post Note  Patient: BLAYKE CORDREY  Procedure(s) Performed: IMAGE GUIDED SINUS SURGERY (N/A Nose) NASAL SEPTOPLASTY WITH INFERIOR TURBINATE REDUCTION (Bilateral Nose) MAXILLARY ANTROSTOMY WITH TISSUE (Bilateral Nose) ENDOSCOPIC CONCHA BULLOSA RESECTION (Left Nose) EXCISION SOFT PALATE MASS (Mouth)     Patient location during evaluation: PACU Anesthesia Type: General Level of consciousness: awake and alert Pain management: pain level controlled Vital Signs Assessment: post-procedure vital signs reviewed and stable Respiratory status: spontaneous breathing and nonlabored ventilation Cardiovascular status: blood pressure returned to baseline Postop Assessment: no apparent nausea or vomiting Anesthetic complications: no   No complications documented.  Tylique Aull [employer]

## 2020-07-09 NOTE — Transfer of Care (Signed)
Immediate Anesthesia Transfer of Care Note  Patient: Earl Lyons  Procedure(s) Performed: IMAGE GUIDED SINUS SURGERY (N/A Nose) NASAL SEPTOPLASTY WITH INFERIOR TURBINATE REDUCTION (Bilateral Nose) MAXILLARY ANTROSTOMY WITH TISSUE (Bilateral Nose) ENDOSCOPIC CONCHA BULLOSA RESECTION (Left Nose) EXCISION SOFT PALATE MASS (Mouth)  Patient Location: PACU  Anesthesia Type: General  Level of Consciousness: awake, alert  and patient cooperative  Airway and Oxygen Therapy: Patient Spontanous Breathing and Patient connected to supplemental oxygen  Post-op Assessment: Post-op Vital signs reviewed, Patient's Cardiovascular Status Stable, Respiratory Function Stable, Patent Airway and No signs of Nausea or vomiting  Post-op Vital Signs: Reviewed and stable  Complications: No complications documented.

## 2020-07-09 NOTE — Anesthesia Preprocedure Evaluation (Addendum)
Anesthesia Evaluation  Patient identified by MRN, date of birth, ID band Patient awake    Reviewed: Allergy & Precautions, NPO status , Patient's Chart, lab work & pertinent test results  Airway Mallampati: II  TM Distance: >3 FB Neck ROM: Full    Dental no notable dental hx.    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 into the right upper nasal cavity and frontal sinus recess"  Reproductive/Obstetrics                            Anesthesia Physical Anesthesia Plan  ASA: III  Anesthesia Plan: General   Post-op Pain Management:    Induction: Intravenous  PONV Risk Score and Plan: 2 and Ondansetron, Dexamethasone, Treatment may vary due to age or medical condition and Midazolam  Airway Management Planned: Oral ETT  Additional Equipment: None  Intra-op Plan:   Post-operative Plan: Extubation in OR  Informed Consent: I have reviewed the patients History and Physical, chart, labs and discussed the procedure including the risks, benefits and alternatives for the proposed anesthesia with the patient or authorized representative who has indicated his/her understanding and acceptance.     Dental advisory given  Plan Discussed with: CRNA  Anesthesia Plan Comments:         Anesthesia Quick Evaluation

## 2020-07-09 NOTE — Op Note (Signed)
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  Superiorly it was to the right side and inferiorly to the left.  His left middle turbinate was huge with polypoid changes.  His right middle turbinate was partially flattened against the lateral wall and the anterior part was bulbous with polypoid degeneration.  Both maxillary sinuses were filled with thick white mucus as there is no drainage from either side.  Procedure: With the patient in a comfortable supine position,  general orotracheal anesthesia was induced without difficulty.     The patient received preoperative Afrin spray for topical decongestion and vasoconstriction.  Intravenous prophylactic antibiotics were administered.  At an appropriate level, the patient was placed in a semi-sitting position.  Nasal vibrissae were trimmed.   1% Xylocaine with 1:100,000 epinephrine, 8 cc's, was infiltrated into the anterior floor of the nose, into the nasal spine region, into the membranous columella, and finally into the submucoperichondrial plane of the septum on both sides.  Several minutes were allowed for this to take effect.  Cottoniod pledgetts soaked in Afrin and 4% Xylocaine were placed into both nasal cavities and left while the patient was prepped and draped in the standard fashion.  The materials were removed from the nose  and observed to be intact and correct in number.  The nose was inspected with a headlight and zero degree scope with the findings as described above.  A left Killian incision was sharply executed and carried down to the quadrangular cartilage. The mucoperichondrium was elelvated along the quadrangular plate back to the bony-cartilaginous junction. The mucoperiostium was then elevated along the ethmoid plate and the vomer. The boney-catilaginous junction was then split with a freer elevator and the mucoperiosteum was elevated on the opposite side. The mucoperiosteum was then elevated along the maxillary crest as needed to expose the crooked bone of the crest.  Boney spurs of the vomer and maxillary crest were removed with Donavan Foil forceps.  The cartilaginous plate was trimmed along its posterior and inferior borders of about 2 mm of cartilage to free it up inferiorly. Some of the deviated ethmoid plate was then fractured and removed with Takahashi forceps to free up the posterior border of the quadrangular plate and allow it to swing back to the midline. The mucosal flaps were placed back into their anatomic position to allow visualization of the airways. The septum now sat in the midline with an improved airway.  A 3-0 Chromic suture on a Keith needle in used to anchor the inferior septum at the nasal spine with a through and through suture. The mucosal flaps are then sutured together using a through and through whip stitch of 4-0 Plain Gut with a mini-Keith needle. This was used to close the [place] incision as well.   The inferior turbinates were then inspected. An incision was created along the inferior aspect of the left inferior turbinate with removal of some of the inferior soft tissue and bone.  Electrocautery was used to control bleeding in the area. The remaining turbinate was then outfractured to open up the airway further. There was no significant bleeding noted. The right turbinate was then  trimmed and outfractured in a similar fashion.  0 degree scope was used on the left side to visualize the airway.  The middle turbinate appeared to be split in the middle and some of the inferior posterior middle turbinate was just hanging outward but was not swollen or inflamed.  The anterior portion of the middle turbinate was very bulbous and enlarged and had thick mucus inside it.  There is polypoid changes of the entire anterior middle turbinate.  Using the microdebrider much of this was trimmed down remove the soft tissue and to trim down the middle turbinate.  This opened up a pocket of some white mucus that was all cleaned out.  There is very little anterior turbinate here at all but mostly all polypoid disease.  Once it was trimmed down electrocautery was used to help control bleeding at its remaining surfaces.  The posterior portion of the middle turbinate was not touched at all.  It was not swollen and did not seem to need any trimming and that it had been sitting here for a long time functioning appropriately.  A 30 degree scope was used to visualize the middle meatus.  The medial wall of the maxillary sinus was retracted back and was concave here.  There is a very small thin uncinate process.  A pediatric side biter was used to incise this and remove the anterior superior portion of the uncinate process.  The posterior portion was then pulled away to see into the maxillary sinus.  There is thick white mucus that was filling most of the sinus with some polypoid changes on the walls of the sinus.  The mucus was suctioned clear and the whole uncinate process and medial wall of the maxillary sinus above the inferior turbinate was trimmed out.  This left a very large meatal window into the left maxillary sinus.  Using the 30 and 70 degree scopes and large curved suctions I cleaned out the sinus and broke some of the polypoid disease the note lateral, posterior, and anterior walls.  This completely  cleaned out the left maxillary sinus.  The area was open without significant bleeding.  The image guided system was used to evaluate the rest the sinuses and the ethmoids were cleaned here and not involved and these were not addressed.  The 0 degree scope was used on the right side to visualize the airway.  The middle turbinate was lateralized and flat up against the uncinate process.  This was medialized to give a better picture of the uncinate.  The middle turbinate was in 2 parts with the anterior portion being a polypoid swelling that was almost separate from the posterior middle turbinate.  This area also was polypoid and as it was trimmed down there was thick white mucus that was inside this from pockets of a concha bullosa.  Electrocautery was used on the trim remnants to help control bleeding.  The middle meatus was now clearly visible and the uncinate process was then incised.  The entire uncinate was then removed.  There was thick white mucus inside the maxillary sinus.  The microdebrider was used for trimming the inferior uncinate process and the medial of the maxillary sinus wall above the inferior turbinate.  This left a very large opening into the middle meatus and with 30  and 70 degree scope she could see the sinus.  It took a long time to suction out all there is very thick white mucus that was like solid glue.  Once it was all cleaned out and the mucous membranes looked fairly healthy and there is no polypoid changes on the side.  Both sides revisualize there is no significant bleeding and the maxillary sinuses were wide open and clean.  The middle turbinates were trimmed anteriorly of the polypoid disease.  Xerogel was then placed over the middle turbinate remnants and then in the opening to the maxillary sinuses.  The airways were then visualized and showed open passageways on both sides that were significantly improved compared to before surgery. There was no signifcant bleeding. Nasal  splints were applied to both sides of the septum using Xomed 0.52mm regular sized splints that were trimmed, and then held in position with a 3-0 Nylon through and through suture.  The oropharynx was then visualized and the tongue depressor was used to see the distal end of the palate.  Just to the right of the uvula there was a 2 to 3 mm papillary growth on the edge of the soft palate.  This was grasped with a biting forceps and was removed with some mucosa around it that was attached to it.  The remaining mucosa and bed was cauterized to help control any bleeding.  The specimen was then sent to pathology.  The patient was turned back over to anesthesia, and awakened, extubated, and taken to the PACU in satisfactory condition.  Dispo:   PACU to home  Plan: Ice, elevation, narcotic analgesia, steroid taper, and prophylactic antibiotics for the duration of indwelling nasal foreign bodies.  We will reevaluate the patient in the office in 6 days and remove the septal splints.  Return to work in 10 days, strenuous activities in two weeks.   Earl Alas Izzy Cour[place] 07/09/2020 11:13 AM

## 2020-07-09 NOTE — Anesthesia Procedure Notes (Addendum)
Procedure Name: Intubation Date/Time: 07/09/2020 9:19 AM Performed by: Mayme Genta, CRNA Pre-anesthesia Checklist: Patient identified, Patient being monitored, Timeout performed, Emergency Drugs available and Suction available Patient Re-evaluated:Patient Re-evaluated prior to induction Oxygen Delivery Method: Circle system utilized Preoxygenation: Pre-oxygenation with 100% oxygen Induction Type: IV induction Ventilation: Mask ventilation without difficulty Laryngoscope Size: 3 and Miller Grade View: Grade I Tube type: Oral Rae Tube size: 7.5 mm Number of attempts: 1 Airway Equipment and Method: Stylet Placement Confirmation: ETT inserted through vocal cords under direct vision,  positive ETCO2 and breath sounds checked- equal and bilateral Secured at: 21 cm Tube secured with: Tape Dental Injury: Teeth and Oropharynx as per pre-operative assessment

## 2020-07-09 NOTE — H&P (Signed)
H&P has been reviewed and patient reevaluated, no changes necessary. To be downloaded later.  

## 2020-07-10 ENCOUNTER — Encounter: Payer: Self-pay | Admitting: Otolaryngology

## 2020-07-13 LAB — SURGICAL PATHOLOGY

## 2020-09-16 ENCOUNTER — Other Ambulatory Visit: Payer: Medicare HMO

## 2020-09-16 ENCOUNTER — Ambulatory Visit: Payer: Medicare HMO | Admitting: Hematology and Oncology

## 2020-09-28 IMAGING — CT NM PET TUM IMG INITIAL (PI) SKULL BASE T - THIGH
1 of 9 series · 1 of 25 positions shown · non-contrast
Comparison: None.

CLINICAL DATA: Initial treatment strategy for cutaneous marginal
zone lymphoma.

EXAM:
NUCLEAR MEDICINE PET SKULL BASE TO THIGH
TECHNIQUE: 9.0 mCi F-18 FDG was injected intravenously. Full-ring PET imaging
was performed from the skull base to thigh after the radiotracer. CT
data was obtained and used for attenuation correction and anatomic
localization.
Fasting blood glucose: 89 mg/dl

[Series 5: pet wb uncorrected (nac) · axial · 5.0mm · 4.07mm/px · 1 of 368 slices shown]
[im 245/368]
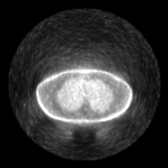

[1 of 25 positions shown; findings below may reference images not displayed]

FINDINGS: Mediastinal blood-pool activity (background): SUV max =

Liver activity (reference): SUV max =

NECK:  No hypermetabolic lymph nodes or masses.

Incidental CT findings:  None.

CHEST: No hypermetabolic masses or lymphadenopathy. No suspicious
pulmonary nodules seen on CT images.

Incidental CT findings:  None.

ABDOMEN/PELVIS: No abnormal hypermetabolic activity within the
liver, pancreas, adrenal glands, or spleen. No hypermetabolic lymph
nodes in the abdomen or pelvis.

Incidental CT findings:  None.

SKELETON: No focal hypermetabolic bone lesions to suggest skeletal
metastasis.

Incidental CT findings:  None.
IMPRESSION: Negative.  No radiographic evidence of malignancy.

## 2020-10-06 ENCOUNTER — Other Ambulatory Visit: Payer: Self-pay

## 2020-10-06 DIAGNOSIS — C884 Extranodal marginal zone B-cell lymphoma of mucosa-associated lymphoid tissue [MALT-lymphoma]: Secondary | ICD-10-CM

## 2020-10-07 ENCOUNTER — Other Ambulatory Visit: Payer: Self-pay

## 2020-10-07 ENCOUNTER — Encounter: Payer: Self-pay | Admitting: Oncology

## 2020-10-07 ENCOUNTER — Inpatient Hospital Stay (HOSPITAL_BASED_OUTPATIENT_CLINIC_OR_DEPARTMENT_OTHER): Payer: Medicare HMO | Admitting: Oncology

## 2020-10-07 ENCOUNTER — Inpatient Hospital Stay: Payer: Medicare HMO | Attending: Oncology

## 2020-10-07 VITALS — BP 138/68 | HR 45 | Temp 97.0°F | Resp 18 | Wt 163.8 lb

## 2020-10-07 DIAGNOSIS — C884 Extranodal marginal zone B-cell lymphoma of mucosa-associated lymphoid tissue [MALT-lymphoma]: Secondary | ICD-10-CM

## 2020-10-07 DIAGNOSIS — Z923 Personal history of irradiation: Secondary | ICD-10-CM | POA: Diagnosis not present

## 2020-10-07 DIAGNOSIS — Z8572 Personal history of non-Hodgkin lymphomas: Secondary | ICD-10-CM | POA: Diagnosis not present

## 2020-10-07 LAB — CBC WITH DIFFERENTIAL/PLATELET
Abs Immature Granulocytes: 0.02 10*3/uL (ref 0.00–0.07)
Basophils Absolute: 0.1 10*3/uL (ref 0.0–0.1)
Basophils Relative: 1 %
Eosinophils Absolute: 0.3 10*3/uL (ref 0.0–0.5)
Eosinophils Relative: 4 %
HCT: 40.8 % (ref 39.0–52.0)
Hemoglobin: 13.7 g/dL (ref 13.0–17.0)
Immature Granulocytes: 0 %
Lymphocytes Relative: 25 %
Lymphs Abs: 1.8 10*3/uL (ref 0.7–4.0)
MCH: 28.8 pg (ref 26.0–34.0)
MCHC: 33.6 g/dL (ref 30.0–36.0)
MCV: 85.7 fL (ref 80.0–100.0)
Monocytes Absolute: 0.9 10*3/uL (ref 0.1–1.0)
Monocytes Relative: 12 %
Neutro Abs: 4.4 10*3/uL (ref 1.7–7.7)
Neutrophils Relative %: 58 %
Platelets: 184 10*3/uL (ref 150–400)
RBC: 4.76 MIL/uL (ref 4.22–5.81)
RDW: 13.2 % (ref 11.5–15.5)
WBC: 7.4 10*3/uL (ref 4.0–10.5)
nRBC: 0 % (ref 0.0–0.2)

## 2020-10-07 LAB — COMPREHENSIVE METABOLIC PANEL
ALT: 18 U/L (ref 0–44)
AST: 27 U/L (ref 15–41)
Albumin: 3.9 g/dL (ref 3.5–5.0)
Alkaline Phosphatase: 32 U/L — ABNORMAL LOW (ref 38–126)
Anion gap: 7 (ref 5–15)
BUN: 23 mg/dL (ref 8–23)
CO2: 25 mmol/L (ref 22–32)
Calcium: 8.9 mg/dL (ref 8.9–10.3)
Chloride: 101 mmol/L (ref 98–111)
Creatinine, Ser: 1.08 mg/dL (ref 0.61–1.24)
GFR, Estimated: >60 mL/min (ref >60)
Glucose, Bld: 98 mg/dL (ref 70–99)
Potassium: 4.2 mmol/L (ref 3.5–5.1)
Sodium: 133 mmol/L — ABNORMAL LOW (ref 135–145)
Total Bilirubin: 1.6 mg/dL — ABNORMAL HIGH (ref 0.3–1.2)
Total Protein: 7.2 g/dL (ref 6.5–8.1)

## 2020-10-07 LAB — LACTATE DEHYDROGENASE: LDH: 158 U/L (ref 98–192)

## 2020-10-07 NOTE — Progress Notes (Signed)
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  There was actinic keratosis extending to the edges but without carcinoma.    PET scan on 01/03/2019 revealed no radiographic evidence of malignancy.    He received electron beam therapy of 3000 cGy from 01/23/2019 - 02/12/2019.   Patient noted to have a polypoid enhancing mass in the left upper nasal cavity on maxillofacial CT in November 2021.  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  Negative for malignancy.  Patient also follows up with dermatology every 6 months  Interval history-presently patient is doing well and denies any specific complaints at this time.  Denies any new skin lesions at this time.  Noted to have a bug bite on the inner aspect of his right thigh.  Appetite and weight is stable.  He  has chronic pain from arthritis  ECOG PS- 1 Pain scale- 0   Review of systems- Review of Systems  Constitutional:  Negative for chills, fever, malaise/fatigue and weight loss.  HENT:  Negative for congestion, ear discharge and nosebleeds.   Eyes:  Negative for blurred vision.  Respiratory:  Negative for cough, hemoptysis, sputum production, shortness of breath and wheezing.   Cardiovascular:  Negative for chest pain, palpitations, orthopnea and claudication.  Gastrointestinal:  Negative for abdominal pain, blood in stool, constipation, diarrhea, heartburn, melena, nausea and vomiting.  Genitourinary:  Negative for dysuria, flank pain, frequency, hematuria and urgency.  Musculoskeletal:  Positive for joint pain. Negative for back pain and myalgias.  Skin:  Negative for rash.  Neurological:  Negative for dizziness, tingling, focal weakness, seizures, weakness and headaches.  Endo/Heme/Allergies:  Does not bruise/bleed easily.  Psychiatric/Behavioral:  Negative for depression and suicidal ideas. The patient does not have insomnia.     Allergies  Allergen Reactions   Metoprolol Other (See Comments)    bradycardia     Past Medical History:  Diagnosis Date   Anginal pain ([place])    In the past--not currently   Arthritis    Osteoarthritis   Asthma    IN THE PAST (NO INHALERS IN THE PAST FIVE YEARS)   Cancer ([place])    Melanomas Skin Cancer   Coronary artery disease    COVID-19 04/25/2020   GERD (gastroesophageal reflux disease)    Hypertension    Vertigo    Wears hearing aid in both ears      Past Surgical History:  Procedure Laterality Date   CARDIAC CATHETERIZATION  ENDOSCOPIC CONCHA BULLOSA RESECTION Left 07/09/2020   Procedure: ENDOSCOPIC CONCHA BULLOSA RESECTION;  Surgeon: Margaretha Sheffield, MD;  Location: [place];  Service: ENT;  Laterality: Left;   EXCISION MASS LOWER EXTREMETIES Right 11/04/2016   Procedure: EXCISION OF MASS  RIGHT THIGH;  Surgeon: Leonie Green, MD;  Location: [facility];  Service: General;  Laterality: Right;   HERNIA REPAIR Right    Inguinal Hernia Repair   IMAGE GUIDED SINUS SURGERY N/A 07/09/2020   Procedure: IMAGE GUIDED SINUS SURGERY;  Surgeon: Margaretha Sheffield, MD;  Location: [place];  Service: ENT;  Laterality: N/A;  need stryker disk PLACED DISK ON OR CHARGE NURSE DESK 05-28-20 KP   KNEE ARTHROSCOPY Right    MASS EXCISION  07/09/2020   Procedure: EXCISION SOFT PALATE MASS;  Surgeon: Margaretha Sheffield, MD;  Location: [place];  Service: ENT;;   MAXILLARY ANTROSTOMY Bilateral 07/09/2020   Procedure: MAXILLARY ANTROSTOMY WITH TISSUE;  Surgeon: Margaretha Sheffield, MD;  Location: [place];  Service: ENT;  Laterality: Bilateral;   NASAL SEPTOPLASTY W/ TURBINOPLASTY Bilateral 07/09/2020   Procedure: NASAL SEPTOPLASTY WITH INFERIOR TURBINATE REDUCTION;  Surgeon: Margaretha Sheffield, MD;  Location: [place];  Service: ENT;  Laterality: Bilateral;   SHOULDER ARTHROSCOPY W/ ROTATOR CUFF REPAIR Right    TONSILLECTOMY     VASECTOMY      Social History   Socioeconomic History   Marital status: Married    Spouse name: Not on file   Number of children: Not on file   Years of education: Not on file   Highest education level: Not on file  Occupational History   Not on file  Tobacco Use   Smoking status: Never   Smokeless tobacco: Never  Vaping Use   Vaping Use: Never used  Substance and Sexual Activity   Alcohol use: No   Drug use: No   Sexual activity: Not on file  Other Topics Concern   Not on file  Social History Narrative   Not on file   Social Determinants of Health   Financial Resource Strain: Not on file  Food Insecurity: Not on file  Transportation Needs: Not on file  Physical Activity: Not on file  Stress: Not on file  Social Connections: Not on file  Intimate Partner Violence: Not on file    Family History  Problem Relation Age of Onset   Skin cancer Mother    Emphysema  Mother    Colon cancer Maternal Uncle      Current Outpatient Medications:    aspirin 81 MG chewable tablet, Chew by mouth., Disp: , Rfl:    atorvastatin (LIPITOR) 40 MG tablet, Take 40 mg by mouth every evening., Disp: , Rfl:    cholecalciferol (VITAMIN D) 1000 units tablet, Take 1,000 Units by mouth daily., Disp: , Rfl:    Cholecalciferol 25 MCG (1000 UT) tablet, Take 1 tablet by mouth daily., Disp: , Rfl:    lamoTRIgine (LAMICTAL) 150 MG tablet, Take 125 mg by mouth 2 (two) times daily., Disp: , Rfl:    lamoTRIgine (LAMICTAL) 25 MG tablet, Take 100 mg by mouth daily. , Disp: , Rfl:    lisinopril (ZESTRIL) 20 MG tablet, Take 30 mg by mouth daily., Disp: , Rfl:    nitroGLYCERIN (NITROSTAT) 0.4 MG SL tablet, Place under the tongue., Disp: , Rfl:    polyethylene glycol (MIRALAX / GLYCOLAX) 17 g packet, Take 17 g by mouth daily., Disp: , Rfl:    polyethylene glycol powder (  GLYCOLAX/MIRALAX) 17 GM/SCOOP powder, Take by mouth., Disp: , Rfl:    psyllium (METAMUCIL) 58.6 % packet, Take by mouth daily. One teaspoon a day, Disp: , Rfl:    atorvastatin (LIPITOR) 80 MG tablet, Take 0.5 tablets by mouth at bedtime. (Patient not taking: Reported on 10/07/2020), Disp: , Rfl:    cephALEXin (KEFLEX) 500 MG capsule, Take 1 capsule (500 mg total) by mouth 2 (two) times daily. (Patient not taking: Reported on 10/07/2020), Disp: 14 capsule, Rfl: 0   dexamethasone (DECADRON) 2 MG tablet, Take by mouth. (Patient not taking: Reported on 10/07/2020), Disp: , Rfl:    diphenhydrAMINE (BENADRYL) 25 mg capsule, Take by mouth. (Patient not taking: Reported on 10/07/2020), Disp: , Rfl:    fluocinonide cream (LIDEX) 0.05 %, Apply topically bid prn (Patient not taking: Reported on 10/07/2020), Disp: , Rfl:    fluticasone (FLONASE) 50 MCG/ACT nasal spray, Place into the nose. (Patient not taking: Reported on 10/07/2020), Disp: , Rfl:    fluticasone (FLONASE) 50 MCG/ACT nasal spray, Place into the nose. (Patient not taking: Reported on  10/07/2020), Disp: , Rfl:    lamoTRIgine (LAMICTAL) 25 MG tablet, Take by mouth. (Patient not taking: Reported on 10/07/2020), Disp: , Rfl:    lisinopril (ZESTRIL) 20 MG tablet, Take by mouth. (Patient not taking: Reported on 10/07/2020), Disp: , Rfl:    loratadine (CLARITIN) 10 MG tablet, Take by mouth. (Patient not taking: Reported on 10/07/2020), Disp: , Rfl:    pantoprazole (PROTONIX) 40 MG tablet, Take 40 mg by mouth daily as needed. (Patient not taking: Reported on 10/07/2020), Disp: , Rfl:    pantoprazole (PROTONIX) 40 MG tablet, Take by mouth. (Patient not taking: Reported on 10/07/2020), Disp: , Rfl:    predniSONE (DELTASONE) 10 MG tablet, Start with 3 pills tomorrow. Taper over the next 6 days.  3,3,2,2,1,1. (Patient not taking: Reported on 10/07/2020), Disp: 12 tablet, Rfl: 0   sildenafil (VIAGRA) 50 MG tablet, Take by mouth. (Patient not taking: No sig reported), Disp: , Rfl:   Physical exam:  Vitals:   10/07/20 0937  BP: 138/68  Pulse: (!) 45  Resp: 18  Temp: (!) 97 F (36.1 C)  SpO2: 98%  Weight: 163 lb 12.8 oz (74.3 kg)   Physical Exam Cardiovascular:     Rate and Rhythm: Normal rate and regular rhythm.     Heart sounds: Normal heart sounds.  Pulmonary:     Effort: Pulmonary effort is normal.     Breath sounds: Normal breath sounds.  Abdominal:     General: Bowel sounds are normal.     Palpations: Abdomen is soft.  Musculoskeletal:     Comments: Erythematous subcentimeter macular lesion noted over the inner aspect of right thigh which appears to be consistent with an insect bite  Lymphadenopathy:     Comments: No palpable cervical, supraclavicular, axillary or inguinal adenopathy    Skin:    General: Skin is warm and dry.     Comments: No suspicious skin lesions noted on today's exam  Neurological:     Mental Status: He is alert and oriented to person, place, and time.     CMP Latest Ref Rng & Units 10/07/2020  Glucose 70 - 99 mg/dL 98  BUN 8 - 23 mg/dL 23  Creatinine  0.61 - 1.24 mg/dL 1.08  Sodium 135 - 145 mmol/L 133(L)  Potassium 3.5 - 5.1 mmol/L 4.2  Chloride 98 - 111 mmol/L 101  CO2 22 - 32 mmol/L 25  Calcium 8.9 -  10.3 mg/dL 8.9  Total Protein 6.5 - 8.1 g/dL 7.2  Total Bilirubin 0.3 - 1.2 mg/dL 1.6(H)  Alkaline Phos 38 - 126 U/L 32(L)  AST 15 - 41 U/L 27  ALT 0 - 44 U/L 18   CBC Latest Ref Rng & Units 10/07/2020  WBC 4.0 - 10.5 K/uL 7.4  Hemoglobin 13.0 - 17.0 g/dL 13.7  Hematocrit 39.0 - 52.0 % 40.8  Platelets 150 - 400 K/uL 184     Assessment and plan- Patient is a 75 y.o. male With stage I cutaneous marginal zone lymphoma s/p shave biopsy and electron-beam therapy in 2020 here for routine surveillance visit   Clinically patient is doing well with no concerning or suspicious skin lesions noted on today's exam.  He is also following up with dermatology every 6 months.  Discussed with the patient that usually cutaneous lymphoma cells are detected on skin exams and not through imaging or blood work.  He does not require any surveillance imaging at this time.  Blood work is otherwise unremarkable.  He has had a mildly elevated bilirubin which has fluctuated between 1.6-1.8 over the last 2 years.  Continue to monitor  I will see him back in 6 months with CBC with differential and CMP   Visit Diagnosis 1. Primary cutaneous marginal zone B-cell lymphoma (HCC)      Dr. Randa Evens, MD, MPH [facility] [phone] 10/07/2020 2:38 PM

## 2021-04-07 ENCOUNTER — Ambulatory Visit: Payer: Medicare HMO | Admitting: Oncology

## 2021-04-07 ENCOUNTER — Other Ambulatory Visit: Payer: Medicare HMO

## 2021-04-14 ENCOUNTER — Ambulatory Visit: Payer: Medicare HMO | Admitting: Oncology

## 2021-04-14 ENCOUNTER — Other Ambulatory Visit: Payer: Medicare HMO

## 2021-04-19 ENCOUNTER — Inpatient Hospital Stay: Payer: Medicare HMO | Attending: Oncology

## 2021-04-19 ENCOUNTER — Other Ambulatory Visit: Payer: Self-pay

## 2021-04-19 ENCOUNTER — Inpatient Hospital Stay: Payer: Medicare HMO | Admitting: Oncology

## 2021-04-19 ENCOUNTER — Encounter: Payer: Self-pay | Admitting: Oncology

## 2021-04-19 VITALS — BP 147/75 | HR 47 | Temp 97.8°F | Wt 165.9 lb

## 2021-04-19 DIAGNOSIS — Z8572 Personal history of non-Hodgkin lymphomas: Secondary | ICD-10-CM | POA: Insufficient documentation

## 2021-04-19 DIAGNOSIS — D696 Thrombocytopenia, unspecified: Secondary | ICD-10-CM | POA: Diagnosis present

## 2021-04-19 DIAGNOSIS — Z7952 Long term (current) use of systemic steroids: Secondary | ICD-10-CM | POA: Diagnosis not present

## 2021-04-19 DIAGNOSIS — C884 Extranodal marginal zone B-cell lymphoma of mucosa-associated lymphoid tissue [MALT-lymphoma]: Secondary | ICD-10-CM

## 2021-04-19 DIAGNOSIS — Z79899 Other long term (current) drug therapy: Secondary | ICD-10-CM | POA: Diagnosis not present

## 2021-04-19 DIAGNOSIS — Z08 Encounter for follow-up examination after completed treatment for malignant neoplasm: Secondary | ICD-10-CM

## 2021-04-19 LAB — CBC WITH DIFFERENTIAL/PLATELET
Abs Immature Granulocytes: 0.02 10*3/uL (ref 0.00–0.07)
Basophils Absolute: 0 10*3/uL (ref 0.0–0.1)
Basophils Relative: 1 %
Eosinophils Absolute: 0.2 10*3/uL (ref 0.0–0.5)
Eosinophils Relative: 2 %
HCT: 40.2 % (ref 39.0–52.0)
Hemoglobin: 13.6 g/dL (ref 13.0–17.0)
Immature Granulocytes: 0 %
Lymphocytes Relative: 24 %
Lymphs Abs: 1.7 10*3/uL (ref 0.7–4.0)
MCH: 29.4 pg (ref 26.0–34.0)
MCHC: 33.8 g/dL (ref 30.0–36.0)
MCV: 87 fL (ref 80.0–100.0)
Monocytes Absolute: 0.9 10*3/uL (ref 0.1–1.0)
Monocytes Relative: 12 %
Neutro Abs: 4.4 10*3/uL (ref 1.7–7.7)
Neutrophils Relative %: 61 %
Platelets: 134 10*3/uL — ABNORMAL LOW (ref 150–400)
RBC: 4.62 MIL/uL (ref 4.22–5.81)
RDW: 13.4 % (ref 11.5–15.5)
WBC: 7.2 10*3/uL (ref 4.0–10.5)
nRBC: 0 % (ref 0.0–0.2)

## 2021-04-19 LAB — COMPREHENSIVE METABOLIC PANEL
ALT: 18 U/L (ref 0–44)
AST: 28 U/L (ref 15–41)
Albumin: 3.9 g/dL (ref 3.5–5.0)
Alkaline Phosphatase: 30 U/L — ABNORMAL LOW (ref 38–126)
Anion gap: 8 (ref 5–15)
BUN: 21 mg/dL (ref 8–23)
CO2: 26 mmol/L (ref 22–32)
Calcium: 9 mg/dL (ref 8.9–10.3)
Chloride: 101 mmol/L (ref 98–111)
Creatinine, Ser: 1.01 mg/dL (ref 0.61–1.24)
GFR, Estimated: >60 mL/min (ref >60)
Glucose, Bld: 109 mg/dL — ABNORMAL HIGH (ref 70–99)
Potassium: 4.2 mmol/L (ref 3.5–5.1)
Sodium: 135 mmol/L (ref 135–145)
Total Bilirubin: 1.7 mg/dL — ABNORMAL HIGH (ref 0.3–1.2)
Total Protein: 6.6 g/dL (ref 6.5–8.1)

## 2021-04-19 NOTE — Progress Notes (Signed)
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  There was actinic keratosis extending to the edges but without carcinoma.     PET scan on 01/03/2019 revealed no radiographic evidence of malignancy.    He received electron beam therapy of 3000 cGy from 01/23/2019 - 02/12/2019.   Patient noted to have a polypoid enhancing mass in the left upper nasal cavity on maxillofacial CT in November 2021.  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  Negative for malignancy.  Interval history-patient continues to follow-up with dermatology and recently had 2 squamous cell carcinomas excised.  Appetite and weight have remained stable.  Denies any specific complaints at this time  ECOG PS- 1 Pain scale- 0   Review of systems- Review of Systems   Constitutional:  Negative for chills, fever, malaise/fatigue and weight loss.  HENT:  Negative for congestion, ear discharge and nosebleeds.   Eyes:  Negative for blurred vision.  Respiratory:  Negative for cough, hemoptysis, sputum production, shortness of breath and wheezing.   Cardiovascular:  Negative for chest pain, palpitations, orthopnea and claudication.  Gastrointestinal:  Negative for abdominal pain, blood in stool, constipation, diarrhea, heartburn, melena, nausea and vomiting.  Genitourinary:  Negative for dysuria, flank pain, frequency, hematuria and urgency.  Musculoskeletal:  Negative for back pain, joint pain and myalgias.  Skin:  Negative for rash.  Neurological:  Negative for dizziness, tingling, focal weakness, seizures, weakness and headaches.  Endo/Heme/Allergies:  Does not bruise/bleed easily.  Psychiatric/Behavioral:  Negative for depression and suicidal ideas. The patient does not have insomnia.      Allergies  Allergen Reactions   Metoprolol Other (See Comments)    bradycardia     Past Medical History:  Diagnosis Date   Anginal pain ([place])    In the past--not currently   Arthritis    Osteoarthritis   Asthma    IN THE PAST (NO INHALERS IN THE PAST FIVE YEARS)   Cancer ([place])    Melanomas Skin Cancer   Coronary artery disease    COVID-19 04/25/2020   GERD (gastroesophageal reflux disease)    Hypertension    Vertigo    Wears hearing aid in both ears      Past Surgical History:  Procedure Laterality Date   CARDIAC CATHETERIZATION     ENDOSCOPIC CONCHA BULLOSA RESECTION Left 07/09/2020   Procedure: ENDOSCOPIC CONCHA BULLOSA RESECTION;  Surgeon: Margaretha Sheffield, MD;  Location: [place];  Service: ENT;  Laterality: Left;   EXCISION MASS LOWER EXTREMETIES Right 11/04/2016   Procedure: EXCISION OF MASS  RIGHT THIGH;  Surgeon: Leonie Green, MD;  Location: [facility];  Service: General;  Laterality: Right;   HERNIA REPAIR Right    Inguinal  Hernia Repair   IMAGE GUIDED SINUS SURGERY N/A 07/09/2020   Procedure: IMAGE GUIDED SINUS SURGERY;  Surgeon: Margaretha Sheffield, MD;  Location: [place];  Service: ENT;  Laterality: N/A;  need stryker disk PLACED DISK ON OR CHARGE NURSE DESK 05-28-20 KP   KNEE ARTHROSCOPY Right    MASS EXCISION  07/09/2020   Procedure: EXCISION SOFT PALATE MASS;  Surgeon: Margaretha Sheffield, MD;  Location: [place];  Service: ENT;;   MAXILLARY ANTROSTOMY Bilateral 07/09/2020   Procedure: MAXILLARY ANTROSTOMY WITH TISSUE;  Surgeon: Margaretha Sheffield, MD;  Location: [place];  Service: ENT;  Laterality: Bilateral;   NASAL SEPTOPLASTY W/ TURBINOPLASTY Bilateral 07/09/2020   Procedure: NASAL SEPTOPLASTY WITH INFERIOR TURBINATE REDUCTION;  Surgeon: Margaretha Sheffield, MD;  Location: [place];  Service: ENT;  Laterality: Bilateral;   SHOULDER ARTHROSCOPY W/ ROTATOR CUFF REPAIR Right    TONSILLECTOMY     VASECTOMY      Social History   Socioeconomic History   Marital status: Married    Spouse name: Not on file   Number of children: Not on file   Years of education: Not on file   Highest education level: Not on file  Occupational History   Not on file  Tobacco Use   Smoking status: Never   Smokeless tobacco: Never  Vaping Use   Vaping Use: Never used  Substance and Sexual Activity   Alcohol use: No   Drug use: No   Sexual activity: Not on file  Other Topics Concern   Not on file  Social History Narrative   Not on file   Social Determinants of Health   Financial Resource Strain: Not on file  Food Insecurity: Not on file  Transportation Needs: Not on file  Physical Activity: Not on file  Stress: Not on file  Social Connections: Not on file  Intimate Partner Violence: Not on file    Family History  Problem Relation Age of Onset   Skin cancer Mother    Emphysema Mother    Colon cancer Maternal Uncle      Current Outpatient Medications:    aspirin 81 MG chewable tablet,  Chew by mouth., Disp: , Rfl:    atorvastatin (LIPITOR) 40 MG tablet, Take 40 mg by mouth every evening., Disp: , Rfl:    cholecalciferol (VITAMIN D) 1000 units tablet, Take 1,000 Units by mouth daily., Disp: , Rfl:    Cholecalciferol 25 MCG (1000 UT) tablet, Take 1 tablet by mouth daily., Disp: , Rfl:    lamoTRIgine (LAMICTAL) 150 MG tablet, Take 125 mg by mouth 2 (two) times daily., Disp: , Rfl:    nitroGLYCERIN (NITROSTAT) 0.4 MG SL tablet, Place under the tongue., Disp: , Rfl:    polyethylene glycol (MIRALAX / GLYCOLAX) 17 g packet, Take 17 g by mouth daily., Disp: , Rfl:    polyethylene glycol powder (GLYCOLAX/MIRALAX) 17 GM/SCOOP powder, Take by mouth., Disp: , Rfl:    psyllium (METAMUCIL) 58.6 % packet, Take by mouth daily. One teaspoon a day, Disp: , Rfl:    atorvastatin (LIPITOR) 80 MG tablet, Take 0.5 tablets by mouth at bedtime. (Patient not taking: Reported on 10/07/2020), Disp: , Rfl:    cephALEXin (KEFLEX) 500 MG capsule, Take  1 capsule (500 mg total) by mouth 2 (two) times daily. (Patient not taking: Reported on 10/07/2020), Disp: 14 capsule, Rfl: 0   dexamethasone (DECADRON) 2 MG tablet, Take by mouth. (Patient not taking: Reported on 10/07/2020), Disp: , Rfl:    diphenhydrAMINE (BENADRYL) 25 mg capsule, Take by mouth. (Patient not taking: Reported on 10/07/2020), Disp: , Rfl:    fluocinonide cream (LIDEX) 0.05 %, Apply topically bid prn (Patient not taking: Reported on 10/07/2020), Disp: , Rfl:    fluticasone (FLONASE) 50 MCG/ACT nasal spray, Place into the nose. (Patient not taking: Reported on 10/07/2020), Disp: , Rfl:    fluticasone (FLONASE) 50 MCG/ACT nasal spray, Place into the nose. (Patient not taking: Reported on 10/07/2020), Disp: , Rfl:    lamoTRIgine (LAMICTAL) 25 MG tablet, Take 100 mg by mouth daily. , Disp: , Rfl:    lamoTRIgine (LAMICTAL) 25 MG tablet, Take by mouth. (Patient not taking: Reported on 10/07/2020), Disp: , Rfl:    lisinopril (ZESTRIL) 20 MG tablet, Take 30 mg by mouth  daily., Disp: , Rfl:    lisinopril (ZESTRIL) 20 MG tablet, Take by mouth. (Patient not taking: Reported on 10/07/2020), Disp: , Rfl:    loratadine (CLARITIN) 10 MG tablet, Take by mouth. (Patient not taking: Reported on 10/07/2020), Disp: , Rfl:    pantoprazole (PROTONIX) 40 MG tablet, Take 40 mg by mouth daily as needed. (Patient not taking: Reported on 10/07/2020), Disp: , Rfl:    pantoprazole (PROTONIX) 40 MG tablet, Take by mouth. (Patient not taking: Reported on 10/07/2020), Disp: , Rfl:    predniSONE (DELTASONE) 10 MG tablet, Start with 3 pills tomorrow. Taper over the next 6 days.  3,3,2,2,1,1. (Patient not taking: Reported on 10/07/2020), Disp: 12 tablet, Rfl: 0   sildenafil (VIAGRA) 50 MG tablet, Take by mouth. (Patient not taking: Reported on 07/02/2020), Disp: , Rfl:   Physical exam:  Vitals:   04/19/21 1024  BP: (!) 147/75  Pulse: (!) 47  Temp: 97.8 F (36.6 C)  TempSrc: Tympanic  Weight: 165 lb 14.4 oz (75.3 kg)   Physical Exam Constitutional:      General: He is not in acute distress. Cardiovascular:     Rate and Rhythm: Normal rate and regular rhythm.     Heart sounds: Normal heart sounds.  Pulmonary:     Effort: Pulmonary effort is normal.     Breath sounds: Normal breath sounds.  Abdominal:     General: Bowel sounds are normal.     Palpations: Abdomen is soft.     Comments: No palpable hepatosplenomegaly  Musculoskeletal:     Cervical back: Normal range of motion.  Lymphadenopathy:     Comments: No palpable cervical, supraclavicular, axillary or inguinal adenopathy    Skin:    General: Skin is warm and dry.  Neurological:     Mental Status: He is alert and oriented to person, place, and time.     CMP Latest Ref Rng & Units 04/19/2021  Glucose 70 - 99 mg/dL 109(H)  BUN 8 - 23 mg/dL 21  Creatinine 0.61 - 1.24 mg/dL 1.01  Sodium 135 - 145 mmol/L 135  Potassium 3.5 - 5.1 mmol/L 4.2  Chloride 98 - 111 mmol/L 101  CO2 22 - 32 mmol/L 26  Calcium 8.9 - 10.3 mg/dL 9.0   Total Protein 6.5 - 8.1 g/dL 6.6  Total Bilirubin 0.3 - 1.2 mg/dL 1.7(H)  Alkaline Phos 38 - 126 U/L 30(L)  AST 15 - 41 U/L 28  ALT 0 -  44 U/L 18   CBC Latest Ref Rng & Units 04/19/2021  WBC 4.0 - 10.5 K/uL 7.2  Hemoglobin 13.0 - 17.0 g/dL 13.6  Hematocrit 39.0 - 52.0 % 40.2  Platelets 150 - 400 K/uL 134(L)     Assessment and plan- Patient is a 76 y.o. male With stage I cutaneous marginal zone lymphoma s/p shave biopsy and electron-beam therapy in 2020.  This is a routine follow-up visit for lymphoma  Clinically patient is doing well with no concerning signs and symptoms of recurrence based on today's exam.  Patient also follows with dermatology for routine skin exams.He has mild thrombocytopenia which waxes and wanes but overall remained stable.  I will see him back in 6 months with CBC with differential CMP and LDH.  Patient has baseline elevated total bilirubin which fluctuates between 1.3-1.7.  Unclear if there is a component of Gilbert's syndrome.  LFTs are normal.  Continue to follow-up with PCP for this   Visit Diagnosis 1. Encounter for follow-up surveillance of lymphoma      Dr. Randa Evens, MD, MPH [facility] at [facility] [phone] 04/19/2021 12:50 PM

## 2021-04-19 NOTE — Progress Notes (Signed)
Pt has no questions or concerns today for this visit.

## 2021-10-18 ENCOUNTER — Other Ambulatory Visit: Payer: Self-pay

## 2021-10-18 DIAGNOSIS — C884 Extranodal marginal zone B-cell lymphoma of mucosa-associated lymphoid tissue [MALT-lymphoma]: Secondary | ICD-10-CM

## 2021-10-19 ENCOUNTER — Inpatient Hospital Stay: Payer: Medicare HMO | Attending: Oncology

## 2021-10-19 ENCOUNTER — Inpatient Hospital Stay (HOSPITAL_BASED_OUTPATIENT_CLINIC_OR_DEPARTMENT_OTHER): Payer: Medicare HMO | Admitting: Oncology

## 2021-10-19 ENCOUNTER — Encounter: Payer: Self-pay | Admitting: Oncology

## 2021-10-19 VITALS — BP 144/59 | HR 43 | Temp 98.7°F | Resp 20 | Wt 163.0 lb

## 2021-10-19 DIAGNOSIS — C884 Extranodal marginal zone B-cell lymphoma of mucosa-associated lymphoid tissue [MALT-lymphoma]: Secondary | ICD-10-CM

## 2021-10-19 DIAGNOSIS — C8300 Small cell B-cell lymphoma, unspecified site: Secondary | ICD-10-CM | POA: Insufficient documentation

## 2021-10-19 DIAGNOSIS — Z8572 Personal history of non-Hodgkin lymphomas: Secondary | ICD-10-CM | POA: Diagnosis not present

## 2021-10-19 DIAGNOSIS — Z08 Encounter for follow-up examination after completed treatment for malignant neoplasm: Secondary | ICD-10-CM | POA: Diagnosis not present

## 2021-10-19 DIAGNOSIS — Z923 Personal history of irradiation: Secondary | ICD-10-CM | POA: Diagnosis not present

## 2021-10-19 LAB — CBC WITH DIFFERENTIAL/PLATELET
Abs Immature Granulocytes: 0.01 10*3/uL (ref 0.00–0.07)
Basophils Absolute: 0.1 10*3/uL (ref 0.0–0.1)
Basophils Relative: 1 %
Eosinophils Absolute: 0.2 10*3/uL (ref 0.0–0.5)
Eosinophils Relative: 3 %
HCT: 40.8 % (ref 39.0–52.0)
Hemoglobin: 13.8 g/dL (ref 13.0–17.0)
Immature Granulocytes: 0 %
Lymphocytes Relative: 24 %
Lymphs Abs: 1.9 10*3/uL (ref 0.7–4.0)
MCH: 29.9 pg (ref 26.0–34.0)
MCHC: 33.8 g/dL (ref 30.0–36.0)
MCV: 88.3 fL (ref 80.0–100.0)
Monocytes Absolute: 1 10*3/uL (ref 0.1–1.0)
Monocytes Relative: 12 %
Neutro Abs: 4.8 10*3/uL (ref 1.7–7.7)
Neutrophils Relative %: 60 %
Platelets: 141 10*3/uL — ABNORMAL LOW (ref 150–400)
RBC: 4.62 MIL/uL (ref 4.22–5.81)
RDW: 14 % (ref 11.5–15.5)
WBC: 8 10*3/uL (ref 4.0–10.5)
nRBC: 0 % (ref 0.0–0.2)

## 2021-10-19 LAB — COMPREHENSIVE METABOLIC PANEL
ALT: 19 U/L (ref 0–44)
AST: 31 U/L (ref 15–41)
Albumin: 3.8 g/dL (ref 3.5–5.0)
Alkaline Phosphatase: 31 U/L — ABNORMAL LOW (ref 38–126)
Anion gap: 6 (ref 5–15)
BUN: 18 mg/dL (ref 8–23)
CO2: 26 mmol/L (ref 22–32)
Calcium: 8.3 mg/dL — ABNORMAL LOW (ref 8.9–10.3)
Chloride: 104 mmol/L (ref 98–111)
Creatinine, Ser: 1.14 mg/dL (ref 0.61–1.24)
GFR, Estimated: >60 mL/min (ref >60)
Glucose, Bld: 113 mg/dL — ABNORMAL HIGH (ref 70–99)
Potassium: 4.3 mmol/L (ref 3.5–5.1)
Sodium: 136 mmol/L (ref 135–145)
Total Bilirubin: 1.6 mg/dL — ABNORMAL HIGH (ref 0.3–1.2)
Total Protein: 6.6 g/dL (ref 6.5–8.1)

## 2021-10-19 LAB — LACTATE DEHYDROGENASE: LDH: 148 U/L (ref 98–192)

## 2021-10-19 NOTE — Progress Notes (Signed)
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  There was actinic keratosis extending to the edges but without carcinoma.    PET scan on 01/03/2019 revealed no radiographic evidence of malignancy.    He received electron beam therapy of 3000 cGy from 01/23/2019 - 02/12/2019.   Patient noted to have a polypoid enhancing mass in the left upper nasal cavity on maxillofacial CT in November 2021.  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  Negative for malignancy.  Interval history-patient feels well and denies any specific complaints at this time.  He continues to follow-up with dermatology and has not had any recent suspicious skin lesions.  Appetite and weight have remained stable  ECOG PS- 1 Pain scale- 0   Review of systems- Review of  Systems  Constitutional:  Negative for chills, fever, malaise/fatigue and weight loss.  HENT:  Negative for congestion, ear discharge and nosebleeds.   Eyes:  Negative for blurred vision.  Respiratory:  Negative for cough, hemoptysis, sputum production, shortness of breath and wheezing.   Cardiovascular:  Negative for chest pain, palpitations, orthopnea and claudication.  Gastrointestinal:  Negative for abdominal pain, blood in stool, constipation, diarrhea, heartburn, melena, nausea and vomiting.  Genitourinary:  Negative for dysuria, flank pain, frequency, hematuria and urgency.  Musculoskeletal:  Negative for back pain, joint pain and myalgias.  Skin:  Negative for rash.  Neurological:  Negative for dizziness, tingling, focal weakness, seizures, weakness and headaches.  Endo/Heme/Allergies:  Does not bruise/bleed easily.  Psychiatric/Behavioral:  Negative for depression and suicidal ideas. The patient does not have insomnia.       Allergies  Allergen Reactions   Metoprolol Other (See Comments)    bradycardia     Past Medical History:  Diagnosis Date   Anginal pain ([place])    In the past--not currently   Arthritis    Osteoarthritis   Asthma    IN THE PAST (NO INHALERS IN THE PAST FIVE YEARS)   Cancer ([place])    Melanomas Skin Cancer   Coronary artery disease    COVID-19 04/25/2020   GERD (gastroesophageal reflux disease)    Hypertension    Vertigo    Wears hearing aid in both ears      Past Surgical History:  Procedure Laterality Date   CARDIAC CATHETERIZATION     ENDOSCOPIC CONCHA BULLOSA RESECTION Left 07/09/2020   Procedure: ENDOSCOPIC CONCHA BULLOSA RESECTION;  Surgeon: Margaretha Sheffield, MD;  [facility];  Service: ENT;  Laterality: Left;   EXCISION MASS LOWER EXTREMETIES Right 11/04/2016   Procedure: EXCISION OF MASS  RIGHT THIGH;  Surgeon: Leonie Green, MD;  Location: [facility];  Service: General;  Laterality: Right;   HERNIA REPAIR Right     Inguinal Hernia Repair   IMAGE GUIDED SINUS SURGERY N/A 07/09/2020   Procedure: IMAGE GUIDED SINUS SURGERY;  Surgeon: Margaretha Sheffield, MD;  Location: [place];  Service: ENT;  Laterality: N/A;  need stryker disk PLACED DISK ON OR CHARGE NURSE DESK 05-28-20 KP   KNEE ARTHROSCOPY Right    MASS EXCISION  07/09/2020   Procedure: EXCISION SOFT PALATE MASS;  Surgeon: Margaretha Sheffield, MD;  Location: [place];  Service: ENT;;   MAXILLARY ANTROSTOMY Bilateral 07/09/2020   Procedure: MAXILLARY ANTROSTOMY WITH TISSUE;  Surgeon: Margaretha Sheffield, MD;  Location: [place];  Service: ENT;  Laterality: Bilateral;   NASAL SEPTOPLASTY W/ TURBINOPLASTY Bilateral 07/09/2020   Procedure: NASAL SEPTOPLASTY WITH INFERIOR TURBINATE REDUCTION;  Surgeon: Margaretha Sheffield, MD;  Location: [place];  Service: ENT;  Laterality: Bilateral;   SHOULDER ARTHROSCOPY W/ ROTATOR CUFF REPAIR Right    TONSILLECTOMY     VASECTOMY      Social History   Socioeconomic History   Marital status: Married    Spouse name: Not on file   Number of children: Not on file   Years of education: Not on file   Highest education level: Not on file  Occupational History   Not on file  Tobacco Use   Smoking status: Never   Smokeless tobacco: Never  Vaping Use   Vaping Use: Never used  Substance and Sexual Activity   Alcohol use: No   Drug use: No   Sexual activity: Not on file  Other Topics Concern   Not on file  Social History Narrative   Not on file   Social Determinants of Health   Financial Resource Strain: Not on file  Food Insecurity: Not on file  Transportation Needs: Not on file  Physical Activity: Not on file  Stress: Not on file  Social Connections: Not on file  Intimate Partner Violence: Not on file    Family History  Problem Relation Age of Onset   Skin cancer Mother    Emphysema Mother    Colon cancer Maternal Uncle      Current Outpatient Medications:    aspirin 81 MG chewable  tablet, Chew by mouth., Disp: , Rfl:    atorvastatin (LIPITOR) 40 MG tablet, Take 40 mg by mouth every evening., Disp: , Rfl:    cholecalciferol (VITAMIN D) 1000 units tablet, Take 1,000 Units by mouth daily., Disp: , Rfl:    Cholecalciferol 25 MCG (1000 UT) tablet, Take 1 tablet by mouth daily., Disp: , Rfl:    lamoTRIgine (LAMICTAL) 150 MG tablet, Take 125 mg by mouth 2 (two) times daily., Disp: , Rfl:    lamoTRIgine (LAMICTAL) 25 MG tablet, Take 100 mg by mouth daily. , Disp: , Rfl:    lisinopril (ZESTRIL) 20 MG tablet, Take 30 mg by mouth daily., Disp: , Rfl:    nitroGLYCERIN (NITROSTAT) 0.4 MG SL tablet, Place under the tongue., Disp: , Rfl:    psyllium (METAMUCIL) 58.6 % packet, Take by mouth daily. One teaspoon a day, Disp: , Rfl:    atorvastatin (LIPITOR) 80 MG tablet, Take 0.5 tablets by mouth at bedtime. (Patient not taking: Reported on 10/07/2020), Disp: , Rfl:    cephALEXin (  KEFLEX) 500 MG capsule, Take 1 capsule (500 mg total) by mouth 2 (two) times daily. (Patient not taking: Reported on 10/07/2020), Disp: 14 capsule, Rfl: 0   dexamethasone (DECADRON) 2 MG tablet, Take by mouth. (Patient not taking: Reported on 10/07/2020), Disp: , Rfl:    diphenhydrAMINE (BENADRYL) 25 mg capsule, Take by mouth. (Patient not taking: Reported on 10/07/2020), Disp: , Rfl:    fluocinonide cream (LIDEX) 0.05 %, Apply topically bid prn (Patient not taking: Reported on 10/07/2020), Disp: , Rfl:    fluticasone (FLONASE) 50 MCG/ACT nasal spray, Place into the nose. (Patient not taking: Reported on 10/07/2020), Disp: , Rfl:    fluticasone (FLONASE) 50 MCG/ACT nasal spray, Place into the nose. (Patient not taking: Reported on 10/07/2020), Disp: , Rfl:    lamoTRIgine (LAMICTAL) 25 MG tablet, Take by mouth. (Patient not taking: Reported on 10/07/2020), Disp: , Rfl:    lisinopril (ZESTRIL) 20 MG tablet, Take by mouth. (Patient not taking: Reported on 10/07/2020), Disp: , Rfl:    loratadine (CLARITIN) 10 MG tablet, Take by mouth.  (Patient not taking: Reported on 10/07/2020), Disp: , Rfl:    pantoprazole (PROTONIX) 40 MG tablet, Take 40 mg by mouth daily as needed. (Patient not taking: Reported on 10/07/2020), Disp: , Rfl:    pantoprazole (PROTONIX) 40 MG tablet, Take by mouth. (Patient not taking: Reported on 10/07/2020), Disp: , Rfl:    polyethylene glycol (MIRALAX / GLYCOLAX) 17 g packet, Take 17 g by mouth daily. (Patient not taking: Reported on 10/19/2021), Disp: , Rfl:    polyethylene glycol powder (GLYCOLAX/MIRALAX) 17 GM/SCOOP powder, Take by mouth. (Patient not taking: Reported on 10/19/2021), Disp: , Rfl:    predniSONE (DELTASONE) 10 MG tablet, Start with 3 pills tomorrow. Taper over the next 6 days.  3,3,2,2,1,1. (Patient not taking: Reported on 10/07/2020), Disp: 12 tablet, Rfl: 0   sildenafil (VIAGRA) 50 MG tablet, Take by mouth. (Patient not taking: Reported on 07/02/2020), Disp: , Rfl:   Physical exam:  Vitals:   10/19/21 1105  BP: (!) 144/59  Pulse: (!) 43  Resp: 20  Temp: 98.7 F (37.1 C)  SpO2: 98%  Weight: 163 lb (73.9 kg)   Physical Exam Constitutional:      General: He is not in acute distress. Cardiovascular:     Rate and Rhythm: Normal rate and regular rhythm.     Heart sounds: Normal heart sounds.  Pulmonary:     Effort: Pulmonary effort is normal.     Breath sounds: Normal breath sounds.  Abdominal:     General: Bowel sounds are normal.     Palpations: Abdomen is soft.  Lymphadenopathy:     Comments: No palpable cervical, supraclavicular, axillary or inguinal adenopathy    Skin:    General: Skin is warm and dry.  Neurological:     Mental Status: He is alert and oriented to person, place, and time.         Latest Ref Rng & Units 10/19/2021   10:48 AM  CMP  Glucose 70 - 99 mg/dL 113   BUN 8 - 23 mg/dL 18   Creatinine 0.61 - 1.24 mg/dL 1.14   Sodium 135 - 145 mmol/L 136   Potassium 3.5 - 5.1 mmol/L 4.3   Chloride 98 - 111 mmol/L 104   CO2 22 - 32 mmol/L 26   Calcium 8.9 - 10.3  mg/dL 8.3   Total Protein 6.5 - 8.1 g/dL 6.6   Total Bilirubin 0.3 - 1.2 mg/dL 1.6  Alkaline Phos 38 - 126 U/L 31   AST 15 - 41 U/L 31   ALT 0 - 44 U/L 19       Latest Ref Rng & Units 10/19/2021   10:48 AM  CBC  WBC 4.0 - 10.5 K/uL 8.0   Hemoglobin 13.0 - 17.0 g/dL 13.8   Hematocrit 39.0 - 52.0 % 40.8   Platelets 150 - 400 K/uL 141      Assessment and plan- Patient is a 76 y.o. male With stage I cutaneous marginal zone lymphoma s/p shave biopsy and electron-beam therapy in 2020.  He is here for routine follow-up visit  Patient reports no B symptoms and clinically there is no significant palpable adenopathy or splenomegaly.  Patient follows up with dermatology regularly as well.  Labs are otherwise unremarkable.  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  This is year 3 of surveillance.  I will see him back in a year with repeat labs   Visit Diagnosis 1. Primary cutaneous marginal zone B-cell lymphoma (HCC)      Dr. Randa Evens, MD, MPH [facility] at [facility] [phone] 10/19/2021 12:57 PM

## 2022-02-23 NOTE — Telephone Encounter (Signed)
Signing encounter, see note 04/13/20

## 2022-05-18 ENCOUNTER — Other Ambulatory Visit (HOSPITAL_COMMUNITY): Payer: Self-pay | Admitting: Neurology

## 2022-05-18 DIAGNOSIS — R4189 Other symptoms and signs involving cognitive functions and awareness: Secondary | ICD-10-CM

## 2022-05-30 ENCOUNTER — Ambulatory Visit (HOSPITAL_COMMUNITY)
Admission: RE | Admit: 2022-05-30 | Discharge: 2022-05-30 | Disposition: A | Discharge status: Home / Self Care | Payer: Medicare HMO | Source: Ambulatory Visit | Attending: Neurology | Admitting: Neurology

## 2022-05-30 DIAGNOSIS — R4189 Other symptoms and signs involving cognitive functions and awareness: Secondary | ICD-10-CM | POA: Insufficient documentation

## 2022-06-27 ENCOUNTER — Inpatient Hospital Stay: Payer: Medicare HMO

## 2022-06-27 ENCOUNTER — Encounter: Payer: Self-pay | Admitting: Oncology

## 2022-06-27 ENCOUNTER — Inpatient Hospital Stay: Payer: Medicare HMO | Attending: Oncology | Admitting: Oncology

## 2022-06-27 VITALS — BP 133/71 | HR 46 | Temp 96.5°F | Resp 18 | Ht 69.0 in | Wt 165.8 lb

## 2022-06-27 DIAGNOSIS — M1990 Unspecified osteoarthritis, unspecified site: Secondary | ICD-10-CM | POA: Insufficient documentation

## 2022-06-27 DIAGNOSIS — J45909 Unspecified asthma, uncomplicated: Secondary | ICD-10-CM | POA: Insufficient documentation

## 2022-06-27 DIAGNOSIS — L570 Actinic keratosis: Secondary | ICD-10-CM | POA: Diagnosis not present

## 2022-06-27 DIAGNOSIS — C884 Extranodal marginal zone B-cell lymphoma of mucosa-associated lymphoid tissue [MALT-lymphoma]: Secondary | ICD-10-CM | POA: Diagnosis not present

## 2022-06-27 DIAGNOSIS — Z800 Family history of malignant neoplasm of digestive organs: Secondary | ICD-10-CM | POA: Diagnosis not present

## 2022-06-27 DIAGNOSIS — Z8572 Personal history of non-Hodgkin lymphomas: Secondary | ICD-10-CM | POA: Diagnosis not present

## 2022-06-27 DIAGNOSIS — I2510 Atherosclerotic heart disease of native coronary artery without angina pectoris: Secondary | ICD-10-CM | POA: Insufficient documentation

## 2022-06-27 DIAGNOSIS — Z79899 Other long term (current) drug therapy: Secondary | ICD-10-CM | POA: Diagnosis not present

## 2022-06-27 DIAGNOSIS — I10 Essential (primary) hypertension: Secondary | ICD-10-CM | POA: Diagnosis not present

## 2022-06-27 DIAGNOSIS — Z8616 Personal history of COVID-19: Secondary | ICD-10-CM | POA: Insufficient documentation

## 2022-06-27 DIAGNOSIS — K219 Gastro-esophageal reflux disease without esophagitis: Secondary | ICD-10-CM | POA: Diagnosis not present

## 2022-06-27 LAB — CBC WITH DIFFERENTIAL/PLATELET
Abs Immature Granulocytes: 0.04 10*3/uL (ref 0.00–0.07)
Basophils Absolute: 0.1 10*3/uL (ref 0.0–0.1)
Basophils Relative: 1 %
Eosinophils Absolute: 0.2 10*3/uL (ref 0.0–0.5)
Eosinophils Relative: 2 %
HCT: 41.1 % (ref 39.0–52.0)
Hemoglobin: 13.5 g/dL (ref 13.0–17.0)
Immature Granulocytes: 0 %
Lymphocytes Relative: 20 %
Lymphs Abs: 1.8 10*3/uL (ref 0.7–4.0)
MCH: 29 pg (ref 26.0–34.0)
MCHC: 32.8 g/dL (ref 30.0–36.0)
MCV: 88.2 fL (ref 80.0–100.0)
Monocytes Absolute: 1 10*3/uL (ref 0.1–1.0)
Monocytes Relative: 11 %
Neutro Abs: 6.1 10*3/uL (ref 1.7–7.7)
Neutrophils Relative %: 66 %
Platelets: 139 10*3/uL — ABNORMAL LOW (ref 150–400)
RBC: 4.66 MIL/uL (ref 4.22–5.81)
RDW: 13.1 % (ref 11.5–15.5)
WBC: 9.2 10*3/uL (ref 4.0–10.5)
nRBC: 0 % (ref 0.0–0.2)

## 2022-06-27 LAB — COMPREHENSIVE METABOLIC PANEL
ALT: 16 U/L (ref 0–44)
AST: 25 U/L (ref 15–41)
Albumin: 3.8 g/dL (ref 3.5–5.0)
Alkaline Phosphatase: 36 U/L — ABNORMAL LOW (ref 38–126)
Anion gap: 5 (ref 5–15)
BUN: 20 mg/dL (ref 8–23)
CO2: 27 mmol/L (ref 22–32)
Calcium: 8.8 mg/dL — ABNORMAL LOW (ref 8.9–10.3)
Chloride: 103 mmol/L (ref 98–111)
Creatinine, Ser: 1.21 mg/dL (ref 0.61–1.24)
GFR, Estimated: >60 mL/min (ref >60)
Glucose, Bld: 103 mg/dL — ABNORMAL HIGH (ref 70–99)
Potassium: 4.3 mmol/L (ref 3.5–5.1)
Sodium: 135 mmol/L (ref 135–145)
Total Bilirubin: 1.3 mg/dL — ABNORMAL HIGH (ref 0.3–1.2)
Total Protein: 7.1 g/dL (ref 6.5–8.1)

## 2022-06-27 LAB — LACTATE DEHYDROGENASE: LDH: 141 U/L (ref 98–192)

## 2022-06-27 NOTE — Progress Notes (Signed)
No concerns for the provider today. 

## 2022-06-27 NOTE — Progress Notes (Signed)
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  There was actinic keratosis extending to the edges but without carcinoma.    PET scan on 01/03/2019 revealed no radiographic evidence of malignancy.    He received electron beam therapy of 3000 cGy from 01/23/2019 - 02/12/2019.   Patient noted to have a polypoid enhancing mass in the left upper nasal cavity on maxillofacial CT in November 2021.  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  Negative for malignancy.  Interval history-patient is doing well for his age and has not had any recent hospitalizations or infections.  Appetite and weight have remained stable.  He also routinely follows up with dermatology and has had suspicious lesions treated as well.    ECOG PS- 1 Pain scale- 0   Review of systems- Review  of Systems  Constitutional:  Negative for chills, fever, malaise/fatigue and weight loss.  HENT:  Negative for congestion, ear discharge and nosebleeds.   Eyes:  Negative for blurred vision.  Respiratory:  Negative for cough, hemoptysis, sputum production, shortness of breath and wheezing.   Cardiovascular:  Negative for chest pain, palpitations, orthopnea and claudication.  Gastrointestinal:  Negative for abdominal pain, blood in stool, constipation, diarrhea, heartburn, melena, nausea and vomiting.  Genitourinary:  Negative for dysuria, flank pain, frequency, hematuria and urgency.  Musculoskeletal:  Negative for back pain, joint pain and myalgias.  Skin:  Negative for rash.  Neurological:  Negative for dizziness, tingling, focal weakness, seizures, weakness and headaches.  Endo/Heme/Allergies:  Does not bruise/bleed easily.  Psychiatric/Behavioral:  Negative for depression and suicidal ideas. The patient does not have insomnia.       Allergies  Allergen Reactions   Metoprolol Other (See Comments)    bradycardia     Past Medical History:  Diagnosis Date   Anginal pain ([place])    In the past--not currently   Arthritis    Osteoarthritis   Asthma    IN THE PAST (NO INHALERS IN THE PAST FIVE YEARS)   Cancer ([place])    Melanomas Skin Cancer   Coronary artery disease    COVID-19 04/25/2020   GERD (gastroesophageal reflux disease)    Hypertension    Vertigo    Wears hearing aid in both ears      Past Surgical History:  Procedure Laterality Date   CARDIAC CATHETERIZATION     ENDOSCOPIC CONCHA BULLOSA RESECTION Left 07/09/2020   Procedure: ENDOSCOPIC CONCHA BULLOSA RESECTION;  Surgeon: Kathyrn Sheriff,  Eddie Dibbles, MD;  Location: [place];  Service: ENT;  Laterality: Left;   EXCISION MASS LOWER EXTREMETIES Right 11/04/2016   Procedure: EXCISION OF MASS  RIGHT THIGH;  Surgeon: Leonie Green, MD;  Location: [facility];  Service: General;  Laterality: Right;   HERNIA REPAIR Right     Inguinal Hernia Repair   IMAGE GUIDED SINUS SURGERY N/A 07/09/2020   Procedure: IMAGE GUIDED SINUS SURGERY;  Surgeon: Margaretha Sheffield, MD;  Location: [place];  Service: ENT;  Laterality: N/A;  need stryker disk PLACED DISK ON OR CHARGE NURSE DESK 05-28-20 KP   KNEE ARTHROSCOPY Right    MASS EXCISION  07/09/2020   Procedure: EXCISION SOFT PALATE MASS;  Surgeon: Margaretha Sheffield, MD;  Location: [place];  Service: ENT;;   MAXILLARY ANTROSTOMY Bilateral 07/09/2020   Procedure: MAXILLARY ANTROSTOMY WITH TISSUE;  Surgeon: Margaretha Sheffield, MD;  Location: [place];  Service: ENT;  Laterality: Bilateral;   NASAL SEPTOPLASTY W/ TURBINOPLASTY Bilateral 07/09/2020   Procedure: NASAL SEPTOPLASTY WITH INFERIOR TURBINATE REDUCTION;  Surgeon: Margaretha Sheffield, MD;  Location: [place];  Service: ENT;  Laterality: Bilateral;   SHOULDER ARTHROSCOPY W/ ROTATOR CUFF REPAIR Right    TONSILLECTOMY     VASECTOMY      Social History   Socioeconomic History   Marital status: Married    Spouse name: Not on file   Number of children: Not on file   Years of education: Not on file   Highest education level: Not on file  Occupational History   Not on file  Tobacco Use   Smoking status: Never   Smokeless tobacco: Never  Vaping Use   Vaping Use: Never used  Substance and Sexual Activity   Alcohol use: No   Drug use: No   Sexual activity: Not on file  Other Topics Concern   Not on file  Social History Narrative   Not on file   Social Determinants of Health   Financial Resource Strain: Not on file  Food Insecurity: Not on file  Transportation Needs: Not on file  Physical Activity: Not on file  Stress: Not on file  Social Connections: Not on file  Intimate Partner Violence: Not on file    Family History  Problem Relation Age of Onset   Skin cancer Mother    Emphysema Mother    Colon cancer Maternal Uncle      Current Outpatient Medications:    cholecalciferol  (VITAMIN D) 1000 units tablet, Take 1,000 Units by mouth daily., Disp: , Rfl:    cyanocobalamin (VITAMIN B12) 1000 MCG tablet, Take by mouth., Disp: , Rfl:    lamoTRIgine (LAMICTAL) 150 MG tablet, Take 125 mg by mouth 2 (two) times daily., Disp: , Rfl:    lisinopril (ZESTRIL) 20 MG tablet, Take 30 mg by mouth daily., Disp: , Rfl:    nitroGLYCERIN (NITROSTAT) 0.4 MG SL tablet, Place under the tongue., Disp: , Rfl:    aspirin 81 MG chewable tablet, Chew by mouth., Disp: , Rfl:    atorvastatin (LIPITOR) 40 MG tablet, Take 40 mg by mouth every evening., Disp: , Rfl:    Cholecalciferol 25 MCG (1000 UT) tablet, Take 1 tablet by mouth daily. (Patient not taking: Reported on 06/27/2022), Disp: , Rfl:    dexamethasone (DECADRON) 2 MG tablet, Take by mouth. (Patient not taking: Reported on 10/07/2020), Disp: , Rfl:    diphenhydrAMINE (BENADRYL) 25 mg capsule, Take by mouth. (Patient not taking: Reported on 10/07/2020), Disp: ,  Rfl:    fluocinonide cream (LIDEX) 0.05 %, Apply topically bid prn (Patient not taking: Reported on 10/07/2020), Disp: , Rfl:    fluticasone (FLONASE) 50 MCG/ACT nasal spray, Place into the nose. (Patient not taking: Reported on 10/07/2020), Disp: , Rfl:    fluticasone (FLONASE) 50 MCG/ACT nasal spray, Place into the nose. (Patient not taking: Reported on 10/07/2020), Disp: , Rfl:    lamoTRIgine (LAMICTAL) 25 MG tablet, Take 100 mg by mouth daily.  (Patient not taking: Reported on 06/27/2022), Disp: , Rfl:    lamoTRIgine (LAMICTAL) 25 MG tablet, Take by mouth. (Patient not taking: Reported on 10/07/2020), Disp: , Rfl:    lisinopril (ZESTRIL) 20 MG tablet, Take by mouth. (Patient not taking: Reported on 10/07/2020), Disp: , Rfl:    loratadine (CLARITIN) 10 MG tablet, Take by mouth. (Patient not taking: Reported on 10/07/2020), Disp: , Rfl:    pantoprazole (PROTONIX) 40 MG tablet, Take 40 mg by mouth daily as needed. (Patient not taking: Reported on 10/07/2020), Disp: , Rfl:    pantoprazole (PROTONIX) 40 MG  tablet, Take by mouth. (Patient not taking: Reported on 10/07/2020), Disp: , Rfl:    polyethylene glycol (MIRALAX / GLYCOLAX) 17 g packet, Take 17 g by mouth daily. (Patient not taking: Reported on 10/19/2021), Disp: , Rfl:    polyethylene glycol powder (GLYCOLAX/MIRALAX) 17 GM/SCOOP powder, Take by mouth. (Patient not taking: Reported on 10/19/2021), Disp: , Rfl:    predniSONE (DELTASONE) 10 MG tablet, Start with 3 pills tomorrow. Taper over the next 6 days.  3,3,2,2,1,1. (Patient not taking: Reported on 10/07/2020), Disp: 12 tablet, Rfl: 0   psyllium (METAMUCIL) 58.6 % packet, Take by mouth daily. One teaspoon a day (Patient not taking: Reported on 06/27/2022), Disp: , Rfl:    sildenafil (VIAGRA) 50 MG tablet, Take by mouth. (Patient not taking: Reported on 07/02/2020), Disp: , Rfl:   Physical exam:  Vitals:   06/27/22 1028  BP: 133/71  Pulse: (!) 46  Resp: 18  Temp: (!) 96.5 F (35.8 C)  TempSrc: Tympanic  Weight: 165 lb 12.8 oz (75.2 kg)  Height: 5\' 9"  (1.753 m)   Physical Exam Cardiovascular:     Rate and Rhythm: Normal rate and regular rhythm.     Heart sounds: Normal heart sounds.  Pulmonary:     Effort: Pulmonary effort is normal.     Breath sounds: Normal breath sounds.  Abdominal:     General: Bowel sounds are normal.     Palpations: Abdomen is soft.  Lymphadenopathy:     Comments: No palpable cervical, supraclavicular, axillary or inguinal adenopathy    Skin:    General: Skin is warm and dry.  Neurological:     Mental Status: He is alert and oriented to person, place, and time.         Latest Ref Rng & Units 06/27/2022   11:18 AM  CMP  Glucose 70 - 99 mg/dL 103   BUN 8 - 23 mg/dL 20   Creatinine 0.61 - 1.24 mg/dL 1.21   Sodium 135 - 145 mmol/L 135   Potassium 3.5 - 5.1 mmol/L 4.3   Chloride 98 - 111 mmol/L 103   CO2 22 - 32 mmol/L 27   Calcium 8.9 - 10.3 mg/dL 8.8   Total Protein 6.5 - 8.1 g/dL 7.1   Total Bilirubin 0.3 - 1.2 mg/dL 1.3   Alkaline Phos 38 - 126  U/L 36   AST 15 - 41 U/L 25   ALT 0 - 44  U/L 16       Latest Ref Rng & Units 06/27/2022   11:18 AM  CBC  WBC 4.0 - 10.5 K/uL 9.2   Hemoglobin 13.0 - 17.0 g/dL 13.5   Hematocrit 39.0 - 52.0 % 41.1   Platelets 150 - 400 K/uL 139     No images are attached to the encounter.  MR BRAIN WO CONTRAST  Result Date: 05/31/2022 CLINICAL DATA:  Subjective cognitive impairment. EXAM: MRI HEAD WITHOUT CONTRAST TECHNIQUE: Multiplanar, multiecho pulse sequences of the brain and surrounding structures were obtained without intravenous contrast. Additionally, using NeuroQuant software a 3D volumetric analysis of the brain was performed and is compared to a normative database adjusted for age, gender and intracranial volume. COMPARISON:  Head MRI 09/12/2017 FINDINGS: Brain: There is no evidence of an acute infarct, intracranial hemorrhage, mass, midline shift, or extra-axial fluid collection. Scattered small T2 hyperintensities in the cerebral white matter bilaterally have at most slightly progressed from the prior MRI and are nonspecific but compatible with minimal chronic small vessel ischemic disease. The ventricles are normal in size. Cerebral volume is unchanged from the prior MRI. Vascular: Major intracranial vascular flow voids are preserved. Skull and upper cervical spine: Unremarkable bone marrow signal. Sinuses/Orbits: Unremarkable orbits. Prior sinus surgery. Decreased volume of soft tissue in the superior left nasal cavity compared to the prior study. Improved aeration of the maxillary sinuses which are now essentially clear. Increased, near complete left frontal sinus opacification. Clear mastoid air cells. Other: None. NeuroQuant Findings: Volumetric analysis of the brain was performed, with a fully detailed report in [facility]. Briefly, the comparison with age and gender matched reference reveals whole brain volume to be at the 22nd percentile. IMPRESSION: 1. No acute intracranial abnormality. 2.  Minimal chronic small vessel ischemic disease. 3. NeuroQuant volumetric analysis of the brain, see details on [employer]. Electronically Signed   By: Logan Bores M.D.   On: 05/31/2022 21:39     Assessment and plan- Patient is a 77 y.o. male With stage I cutaneous marginal zone lymphoma s/p shave biopsy and electron-beam therapy in 2020.  He is here for routine follow-up visit  No palpable adenopathy or hepatosplenomegaly on today's exam.  Patient does not report any B symptoms.  He is also routinely following up with dermatology.  This is here for of surveillance and I will see him back in 1 year with labs.  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  Continue to monitor   Visit Diagnosis 1. Primary cutaneous marginal zone B-cell lymphoma (HCC)      Dr. Randa Evens, MD, MPH [facility] at [facility] [id number] 06/27/2022 1:02 PM

## 2022-10-25 ENCOUNTER — Other Ambulatory Visit: Payer: Medicare HMO

## 2022-10-25 ENCOUNTER — Ambulatory Visit: Payer: Medicare HMO | Admitting: Oncology

## 2023-06-27 ENCOUNTER — Inpatient Hospital Stay: Payer: Medicare HMO | Admitting: Oncology

## 2023-06-27 ENCOUNTER — Encounter: Payer: Self-pay | Admitting: Oncology

## 2023-06-27 ENCOUNTER — Inpatient Hospital Stay: Payer: Medicare HMO | Attending: Oncology

## 2023-06-27 VITALS — BP 116/60 | HR 47 | Temp 96.3°F | Resp 19 | Ht 69.0 in | Wt 157.0 lb

## 2023-06-27 DIAGNOSIS — R7989 Other specified abnormal findings of blood chemistry: Secondary | ICD-10-CM | POA: Diagnosis not present

## 2023-06-27 DIAGNOSIS — C884 Extranodal marginal zone b-cell lymphoma of mucosa-associated lymphoid tissue (malt-lymphoma) not having achieved remission: Secondary | ICD-10-CM

## 2023-06-27 DIAGNOSIS — Z79899 Other long term (current) drug therapy: Secondary | ICD-10-CM | POA: Insufficient documentation

## 2023-06-27 LAB — CBC WITH DIFFERENTIAL/PLATELET
Abs Immature Granulocytes: 0.02 10*3/uL (ref 0.00–0.07)
Basophils Absolute: 0.1 10*3/uL (ref 0.0–0.1)
Basophils Relative: 1 %
Eosinophils Absolute: 0.2 10*3/uL (ref 0.0–0.5)
Eosinophils Relative: 2 %
HCT: 42.5 % (ref 39.0–52.0)
Hemoglobin: 14.1 g/dL (ref 13.0–17.0)
Immature Granulocytes: 0 %
Lymphocytes Relative: 22 %
Lymphs Abs: 1.8 10*3/uL (ref 0.7–4.0)
MCH: 29.3 pg (ref 26.0–34.0)
MCHC: 33.2 g/dL (ref 30.0–36.0)
MCV: 88.2 fL (ref 80.0–100.0)
Monocytes Absolute: 0.9 10*3/uL (ref 0.1–1.0)
Monocytes Relative: 11 %
Neutro Abs: 5.1 10*3/uL (ref 1.7–7.7)
Neutrophils Relative %: 64 %
Platelets: 160 10*3/uL (ref 150–400)
RBC: 4.82 MIL/uL (ref 4.22–5.81)
RDW: 13.1 % (ref 11.5–15.5)
WBC: 8 10*3/uL (ref 4.0–10.5)
nRBC: 0 % (ref 0.0–0.2)

## 2023-06-27 LAB — LACTATE DEHYDROGENASE: LDH: 147 U/L (ref 98–192)

## 2023-06-27 LAB — COMPREHENSIVE METABOLIC PANEL
ALT: 18 U/L (ref 0–44)
AST: 26 U/L (ref 15–41)
Albumin: 3.9 g/dL (ref 3.5–5.0)
Alkaline Phosphatase: 35 U/L — ABNORMAL LOW (ref 38–126)
Anion gap: 11 (ref 5–15)
BUN: 23 mg/dL (ref 8–23)
CO2: 24 mmol/L (ref 22–32)
Calcium: 9 mg/dL (ref 8.9–10.3)
Chloride: 102 mmol/L (ref 98–111)
Creatinine, Ser: 1.33 mg/dL — ABNORMAL HIGH (ref 0.61–1.24)
GFR, Estimated: 55 mL/min — ABNORMAL LOW (ref >60)
Glucose, Bld: 102 mg/dL — ABNORMAL HIGH (ref 70–99)
Potassium: 4.3 mmol/L (ref 3.5–5.1)
Sodium: 137 mmol/L (ref 135–145)
Total Bilirubin: 1.3 mg/dL — ABNORMAL HIGH (ref 0.0–1.2)
Total Protein: 7.1 g/dL (ref 6.5–8.1)

## 2023-06-27 NOTE — Progress Notes (Signed)
 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  There was actinic keratosis extending to the edges but without carcinoma.    PET scan on 01/03/2019 revealed no radiographic evidence of malignancy.    He received electron beam therapy of 3000 cGy from 01/23/2019 - 02/12/2019.   Patient noted to have a polypoid enhancing mass in the left upper nasal cavity on maxillofacial CT in November 2021.  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  Negative for malignancy.  Interval history-patient is doing well presently and denies any changes in his appetite or weight.  Denies any new aches and pains anywhere.  He follows up with dermatology on a yearly basis.  ECOG PS- 1 Pain scale- 0   Review of systems- Review of Systems  Constitutional:  Negative for chills,  fever, malaise/fatigue and weight loss.  HENT:  Negative for congestion, ear discharge and nosebleeds.   Eyes:  Negative for blurred vision.  Respiratory:  Negative for cough, hemoptysis, sputum production, shortness of breath and wheezing.   Cardiovascular:  Negative for chest pain, palpitations, orthopnea and claudication.  Gastrointestinal:  Negative for abdominal pain, blood in stool, constipation, diarrhea, heartburn, melena, nausea and vomiting.  Genitourinary:  Negative for dysuria, flank pain, frequency, hematuria and urgency.  Musculoskeletal:  Negative for back pain, joint pain and myalgias.  Skin:  Negative for rash.  Neurological:  Negative for dizziness, tingling, focal weakness, seizures, weakness and headaches.  Endo/Heme/Allergies:  Does not bruise/bleed easily.  Psychiatric/Behavioral:  Negative for depression and suicidal ideas. The patient does not have insomnia.       Allergies  Allergen Reactions   Metoprolol Other (See Comments)    bradycardia     Past Medical History:  Diagnosis Date   Anginal pain (HCC)    In the past--not currently   Arthritis    Osteoarthritis   Asthma    IN THE PAST (NO INHALERS IN THE PAST FIVE YEARS)   Cancer (HCC)    Melanomas Skin Cancer   Coronary artery disease    COVID-19 04/25/2020   GERD (gastroesophageal reflux disease)    Hypertension    Vertigo    Wears hearing aid in both ears      Past Surgical History:  Procedure Laterality Date   CARDIAC CATHETERIZATION     ENDOSCOPIC CONCHA BULLOSA RESECTION Left 07/09/2020   Procedure: ENDOSCOPIC CONCHA BULLOSA RESECTION;  Surgeon: Vernie Murders, MD;  Location: [facility];  Service: ENT;  Laterality: Left;   EXCISION MASS LOWER EXTREMETIES Right 11/04/2016   Procedure: EXCISION OF MASS  RIGHT THIGH;  Surgeon: Nadeen Landau, MD;  Location: [facility];  Service: General;  Laterality: Right;   HERNIA REPAIR Right    Inguinal Hernia Repair   IMAGE GUIDED SINUS  SURGERY N/A 07/09/2020   Procedure: IMAGE GUIDED SINUS SURGERY;  Surgeon: Vernie Murders, MD;  Location: [facility];  Service: ENT;  Laterality: N/A;  need stryker disk PLACED DISK ON OR CHARGE NURSE DESK 05-28-20 KP   KNEE ARTHROSCOPY Right    MASS EXCISION  07/09/2020   Procedure: EXCISION SOFT PALATE MASS;  Surgeon: Vernie Murders, MD;  Location: [facility];  Service: ENT;;   MAXILLARY ANTROSTOMY Bilateral 07/09/2020   Procedure: MAXILLARY ANTROSTOMY WITH TISSUE;  Surgeon: Vernie Murders, MD;  Location: [facility];  Service: ENT;  Laterality: Bilateral;   NASAL SEPTOPLASTY W/ TURBINOPLASTY Bilateral 07/09/2020   Procedure: NASAL SEPTOPLASTY WITH INFERIOR TURBINATE REDUCTION;  Surgeon: Vernie Murders, MD;  Location: [facility];  Service: ENT;  Laterality: Bilateral;   SHOULDER ARTHROSCOPY W/ ROTATOR CUFF REPAIR Right    TONSILLECTOMY     VASECTOMY      Social History   Socioeconomic History   Marital status: Married    Spouse name: Not on file   Number of children: Not on file   Years of education: Not on file   Highest education level: Not on file  Occupational History   Not on file  Tobacco Use   Smoking status: Never   Smokeless tobacco: Never  Vaping Use   Vaping status: Never Used  Substance and Sexual Activity   Alcohol use: No   Drug use: No   Sexual activity: Not on file  Other Topics Concern   Not on file  Social History Narrative   Not on file   Social Drivers of Health   Financial Resource Strain: Low Risk  (03/22/2023)   Received from [facility]   Overall Financial Resource Strain (CARDIA)    Difficulty of Paying Living Expenses: Not hard at all  Food Insecurity: No Food Insecurity (03/22/2023)   Received from [facility]   Hunger Vital Sign    Worried About Running Out of Food in the Last Year: Never true    Ran Out of Food in the Last Year: Never true  Transportation Needs: No Transportation  Needs (03/22/2023)   Received from [facility] - Transportation    In the past 12 months, has lack of transportation kept you from medical appointments or from getting medications?: No    Lack of Transportation (Non-Medical): No  Physical Activity: Not on file  Stress: Not on file  Social Connections: Not on file  Intimate Partner Violence: Not on file    Family History  Problem Relation Age of Onset   Skin cancer Mother    Emphysema Mother    Colon cancer Maternal Uncle      Current Outpatient Medications:    aspirin 81 MG chewable tablet, Chew by mouth., Disp: , Rfl:    atorvastatin (LIPITOR) 40 MG tablet, Take 40 mg by mouth every evening., Disp: , Rfl:    cholecalciferol (VITAMIN D) 1000 units tablet, Take 1,000 Units by mouth daily., Disp: , Rfl:    Cholecalciferol 25 MCG (1000 UT) tablet, Take 1 tablet by mouth daily. (Patient not taking: Reported on 06/27/2022), Disp: , Rfl:  cyanocobalamin (VITAMIN B12) 1000 MCG tablet, Take by mouth., Disp: , Rfl:    dexamethasone (DECADRON) 2 MG tablet, Take by mouth. (Patient not taking: Reported on 10/07/2020), Disp: , Rfl:    diphenhydrAMINE (BENADRYL) 25 mg capsule, Take by mouth. (Patient not taking: Reported on 10/07/2020), Disp: , Rfl:    fluocinonide cream (LIDEX) 0.05 %, Apply topically bid prn (Patient not taking: Reported on 10/07/2020), Disp: , Rfl:    fluticasone (FLONASE) 50 MCG/ACT nasal spray, Place into the nose. (Patient not taking: Reported on 10/07/2020), Disp: , Rfl:    fluticasone (FLONASE) 50 MCG/ACT nasal spray, Place into the nose. (Patient not taking: Reported on 10/07/2020), Disp: , Rfl:    lamoTRIgine (LAMICTAL) 150 MG tablet, Take 125 mg by mouth 2 (two) times daily., Disp: , Rfl:    lamoTRIgine (LAMICTAL) 25 MG tablet, Take 100 mg by mouth daily.  (Patient not taking: Reported on 06/27/2022), Disp: , Rfl:    lamoTRIgine (LAMICTAL) 25 MG tablet, Take by mouth. (Patient not taking: Reported on  10/07/2020), Disp: , Rfl:    lisinopril (ZESTRIL) 20 MG tablet, Take 30 mg by mouth daily., Disp: , Rfl:    lisinopril (ZESTRIL) 20 MG tablet, Take by mouth. (Patient not taking: Reported on 10/07/2020), Disp: , Rfl:    loratadine (CLARITIN) 10 MG tablet, Take by mouth. (Patient not taking: Reported on 10/07/2020), Disp: , Rfl:    nitroGLYCERIN (NITROSTAT) 0.4 MG SL tablet, Place under the tongue., Disp: , Rfl:    pantoprazole (PROTONIX) 40 MG tablet, Take 40 mg by mouth daily as needed. (Patient not taking: Reported on 10/07/2020), Disp: , Rfl:    pantoprazole (PROTONIX) 40 MG tablet, Take by mouth. (Patient not taking: Reported on 10/07/2020), Disp: , Rfl:    polyethylene glycol (MIRALAX / GLYCOLAX) 17 g packet, Take 17 g by mouth daily. (Patient not taking: Reported on 10/19/2021), Disp: , Rfl:    polyethylene glycol powder (GLYCOLAX/MIRALAX) 17 GM/SCOOP powder, Take by mouth. (Patient not taking: Reported on 10/19/2021), Disp: , Rfl:    predniSONE (DELTASONE) 10 MG tablet, Start with 3 pills tomorrow. Taper over the next 6 days.  3,3,2,2,1,1. (Patient not taking: Reported on 10/07/2020), Disp: 12 tablet, Rfl: 0   psyllium (METAMUCIL) 58.6 % packet, Take by mouth daily. One teaspoon a day (Patient not taking: Reported on 06/27/2022), Disp: , Rfl:    sildenafil (VIAGRA) 50 MG tablet, Take by mouth. (Patient not taking: Reported on 07/02/2020), Disp: , Rfl:   Physical exam:  Vitals:   06/27/23 1100  BP: 116/60  Pulse: (!) 47  Resp: 19  Temp: (!) 96.3 F (35.7 C)  TempSrc: Tympanic  SpO2: 99%  Weight: 157 lb (71.2 kg)  Height: 5\' 9"  (1.753 m)   Physical Exam Cardiovascular:     Rate and Rhythm: Normal rate and regular rhythm.     Heart sounds: Normal heart sounds.  Pulmonary:     Effort: Pulmonary effort is normal.     Breath sounds: Normal breath sounds.  Abdominal:     General: Bowel sounds are normal.     Palpations: Abdomen is soft.     Comments: No palpable hepatosplenomegaly   Lymphadenopathy:     Comments: No palpable cervical, supraclavicular, axillary or inguinal adenopathy    Skin:    General: Skin is warm and dry.  Neurological:     Mental Status: He is alert and oriented to person, place, and time.         Latest Ref Rng &  Units 06/27/2023   10:51 AM  CMP  Glucose 70 - 99 mg/dL 161   BUN 8 - 23 mg/dL 23   Creatinine 0.96 - 1.24 mg/dL 0.45   Sodium 409 - 811 mmol/L 137   Potassium 3.5 - 5.1 mmol/L 4.3   Chloride 98 - 111 mmol/L 102   CO2 22 - 32 mmol/L 24   Calcium 8.9 - 10.3 mg/dL 9.0   Total Protein 6.5 - 8.1 g/dL 7.1   Total Bilirubin 0.0 - 1.2 mg/dL 1.3   Alkaline Phos 38 - 126 U/L 35   AST 15 - 41 U/L 26   ALT 0 - 44 U/L 18       Latest Ref Rng & Units 06/27/2023   10:51 AM  CBC  WBC 4.0 - 10.5 K/uL 8.0   Hemoglobin 13.0 - 17.0 g/dL 91.4   Hematocrit 78.2 - 52.0 % 42.5   Platelets 150 - 400 K/uL 160      Assessment and plan- Patient is a 78 y.o. male With stage I cutaneous marginal zone lymphoma s/p shave biopsy and electron-beam therapy in 2020.   Clinically patient is doing well with no concerning signs and symptoms of recurrence based on today's exam.  No B symptoms and labs are unremarkable.  Serum creatinine is mildly elevated at 1.3 as compared to his baseline which can be followed by his primary care doctor.  He is 5 years out of his marginal zone lymphoma diagnosis and he can continue to follow-up with his primary care provider at this time and can be referred to [place] in the future if questions or concerns arise   Visit Diagnosis 1. Primary cutaneous marginal zone B-cell lymphoma      Dr. Owens Shark, MD, MPH [facility] at [facility] [phone] 06/27/2023 12:57 PM
